# Patient Record
Sex: Female | Born: 1970 | Race: White | Hispanic: No | Marital: Single | Smoking: Current every day smoker
Health system: Southern US, Community
[De-identification: ages and names within clinical notes are randomized; demographics above are authoritative.]

## PROBLEM LIST (undated history)

## (undated) DIAGNOSIS — M1990 Unspecified osteoarthritis, unspecified site: Secondary | ICD-10-CM

## (undated) DIAGNOSIS — Z87442 Personal history of urinary calculi: Secondary | ICD-10-CM

## (undated) DIAGNOSIS — I82409 Acute embolism and thrombosis of unspecified deep veins of unspecified lower extremity: Secondary | ICD-10-CM

---

## 2013-06-29 ENCOUNTER — Ambulatory Visit: Payer: Self-pay | Admitting: Family Medicine

## 2014-09-14 ENCOUNTER — Ambulatory Visit: Payer: Self-pay | Admitting: Podiatry

## 2014-10-18 ENCOUNTER — Ambulatory Visit: Payer: Self-pay | Admitting: Family Medicine

## 2016-02-08 ENCOUNTER — Other Ambulatory Visit: Payer: Self-pay | Admitting: Family Medicine

## 2016-02-08 DIAGNOSIS — R1032 Left lower quadrant pain: Secondary | ICD-10-CM

## 2016-02-09 ENCOUNTER — Other Ambulatory Visit: Payer: Self-pay | Admitting: Family Medicine

## 2016-02-09 DIAGNOSIS — R1032 Left lower quadrant pain: Secondary | ICD-10-CM

## 2016-02-09 DIAGNOSIS — R102 Pelvic and perineal pain: Secondary | ICD-10-CM

## 2016-02-10 ENCOUNTER — Other Ambulatory Visit: Payer: Self-pay | Admitting: Family Medicine

## 2016-02-10 ENCOUNTER — Ambulatory Visit
Admission: RE | Admit: 2016-02-10 | Discharge: 2016-02-10 | Disposition: A | Discharge status: Home / Self Care | Payer: Medicaid Other | Source: Ambulatory Visit | Attending: Family Medicine | Admitting: Family Medicine

## 2016-02-10 ENCOUNTER — Ambulatory Visit: Payer: Medicaid Other

## 2016-02-10 DIAGNOSIS — R109 Unspecified abdominal pain: Secondary | ICD-10-CM

## 2016-02-10 DIAGNOSIS — R1012 Left upper quadrant pain: Secondary | ICD-10-CM | POA: Diagnosis not present

## 2016-02-10 DIAGNOSIS — R102 Pelvic and perineal pain: Secondary | ICD-10-CM

## 2016-02-10 DIAGNOSIS — R1032 Left lower quadrant pain: Secondary | ICD-10-CM

## 2016-03-28 ENCOUNTER — Emergency Department: Payer: Medicaid Other

## 2016-03-28 ENCOUNTER — Encounter: Payer: Self-pay | Admitting: *Deleted

## 2016-03-28 ENCOUNTER — Emergency Department
Admission: EM | Admit: 2016-03-28 | Discharge: 2016-03-28 | Disposition: A | Discharge status: Home / Self Care | Payer: Medicaid Other | Attending: Student | Admitting: Student

## 2016-03-28 DIAGNOSIS — W228XXA Striking against or struck by other objects, initial encounter: Secondary | ICD-10-CM | POA: Diagnosis not present

## 2016-03-28 DIAGNOSIS — Z86718 Personal history of other venous thrombosis and embolism: Secondary | ICD-10-CM | POA: Insufficient documentation

## 2016-03-28 DIAGNOSIS — Y939 Activity, unspecified: Secondary | ICD-10-CM | POA: Insufficient documentation

## 2016-03-28 DIAGNOSIS — Y929 Unspecified place or not applicable: Secondary | ICD-10-CM | POA: Insufficient documentation

## 2016-03-28 DIAGNOSIS — F17200 Nicotine dependence, unspecified, uncomplicated: Secondary | ICD-10-CM | POA: Insufficient documentation

## 2016-03-28 DIAGNOSIS — S60222A Contusion of left hand, initial encounter: Secondary | ICD-10-CM | POA: Diagnosis not present

## 2016-03-28 DIAGNOSIS — S6992XA Unspecified injury of left wrist, hand and finger(s), initial encounter: Secondary | ICD-10-CM | POA: Diagnosis present

## 2016-03-28 DIAGNOSIS — Y999 Unspecified external cause status: Secondary | ICD-10-CM | POA: Insufficient documentation

## 2016-03-28 HISTORY — DX: Acute embolism and thrombosis of unspecified deep veins of unspecified lower extremity: I82.409

## 2016-03-28 MED ORDER — IBUPROFEN 800 MG PO TABS: 800.0000 mg | ORAL_TABLET | Freq: Three times a day (TID) | ORAL | Status: DC | PRN

## 2016-03-28 NOTE — Discharge Instructions (Signed)
Cryotherapy °Cryotherapy means treatment with cold. Ice or gel packs can be used to reduce both pain and swelling. Ice is the most helpful within the first 24 to 48 hours after an injury or flare-up from overusing a muscle or joint. Sprains, strains, spasms, burning pain, shooting pain, and aches can all be eased with ice. Ice can also be used when recovering from surgery. Ice is effective, has very few side effects, and is safe for most people to use. °PRECAUTIONS  °Ice is not a safe treatment option for people with: °· Raynaud phenomenon. This is a condition affecting small blood vessels in the extremities. Exposure to cold may cause your problems to return. °· Cold hypersensitivity. There are many forms of cold hypersensitivity, including: °· Cold urticaria. Red, itchy hives appear on the skin when the tissues begin to warm after being iced. °· Cold erythema. This is a red, itchy rash caused by exposure to cold. °· Cold hemoglobinuria. Red blood cells break down when the tissues begin to warm after being iced. The hemoglobin that carry oxygen are passed into the urine because they cannot combine with blood proteins fast enough. °· Numbness or altered sensitivity in the area being iced. °If you have any of the following conditions, do not use ice until you have discussed cryotherapy with your caregiver: °· Heart conditions, such as arrhythmia, angina, or chronic heart disease. °· High blood pressure. °· Healing wounds or open skin in the area being iced. °· Current infections. °· Rheumatoid arthritis. °· Poor circulation. °· Diabetes. °Ice slows the blood flow in the region it is applied. This is beneficial when trying to stop inflamed tissues from spreading irritating chemicals to surrounding tissues. However, if you expose your skin to cold temperatures for too long or without the proper protection, you can damage your skin or nerves. Watch for signs of skin damage due to cold. °HOME CARE INSTRUCTIONS °Follow  these tips to use ice and cold packs safely. °· Place a dry or damp towel between the ice and skin. A damp towel will cool the skin more quickly, so you may need to shorten the time that the ice is used. °· For a more rapid response, add gentle compression to the ice. °· Ice for no more than 10 to 20 minutes at a time. The bonier the area you are icing, the less time it will take to get the benefits of ice. °· Check your skin after 5 minutes to make sure there are no signs of a poor response to cold or skin damage. °· Rest 20 minutes or more between uses. °· Once your skin is numb, you can end your treatment. You can test numbness by very lightly touching your skin. The touch should be so light that you do not see the skin dimple from the pressure of your fingertip. When using ice, most people will feel these normal sensations in this order: cold, burning, aching, and numbness. °· Do not use ice on someone who cannot communicate their responses to pain, such as small children or people with dementia. °HOW TO MAKE AN ICE PACK °Ice packs are the most common way to use ice therapy. Other methods include ice massage, ice baths, and cryosprays. Muscle creams that cause a cold, tingly feeling do not offer the same benefits that ice offers and should not be used as a substitute unless recommended by your caregiver. °To make an ice pack, do one of the following: °· Place crushed ice or a   bag of frozen vegetables in a sealable plastic bag. Squeeze out the excess air. Place this bag inside another plastic bag. Slide the bag into a pillowcase or place a damp towel between your skin and the bag.  Mix 3 parts water with 1 part rubbing alcohol. Freeze the mixture in a sealable plastic bag. When you remove the mixture from the freezer, it will be slushy. Squeeze out the excess air. Place this bag inside another plastic bag. Slide the bag into a pillowcase or place a damp towel between your skin and the bag. SEEK MEDICAL CARE  IF:  You develop white spots on your skin. This may give the skin a blotchy (mottled) appearance.  Your skin turns blue or pale.  Your skin becomes waxy or hard.  Your swelling gets worse. MAKE SURE YOU:   Understand these instructions.  Will watch your condition.  Will get help right away if you are not doing well or get worse.   This information is not intended to replace advice given to you by your health care provider. Make sure you discuss any questions you have with your health care provider.   Document Released: 05/14/2011 Document Revised: 10/08/2014 Document Reviewed: 05/14/2011 Elsevier Interactive Patient Education 2016 [place] A hand contusion is a deep bruise on your hand area. Contusions are the result of an injury that caused bleeding under the skin. The contusion may turn blue, purple, or yellow. Minor injuries will give you a painless contusion, but more severe contusions may stay painful and swollen for a few weeks. CAUSES  A contusion is usually caused by a blow, trauma, or direct force to an area of the body. SYMPTOMS   Swelling and redness of the injured area.  Discoloration of the injured area.  Tenderness and soreness of the injured area.  Pain. DIAGNOSIS  The diagnosis can be made by taking a history and performing a physical exam. An X-ray, CT scan, or MRI may be needed to determine if there were any associated injuries, such as broken bones (fractures). TREATMENT  Often, the best treatment for a hand contusion is resting, elevating, icing, and applying cold compresses to the injured area. Over-the-counter medicines may also be recommended for pain control. HOME CARE INSTRUCTIONS   Put ice on the injured area.  Put ice in a plastic bag.  Place a towel between your skin and the bag.  Leave the ice on for 15-20 minutes, 03-04 times a day.  Only take over-the-counter or prescription medicines as directed by your caregiver.  Your caregiver may recommend avoiding anti-inflammatory medicines (aspirin, ibuprofen, and naproxen) for 48 hours because these medicines may increase bruising.  If told, use an elastic wrap as directed. This can help reduce swelling. You may remove the wrap for sleeping, showering, and bathing. If your fingers become numb, cold, or blue, take the wrap off and reapply it more loosely.  Elevate your hand with pillows to reduce swelling.  Avoid overusing your hand if it is painful. SEEK IMMEDIATE MEDICAL CARE IF:   You have increased redness, swelling, or pain in your hand.  Your swelling or pain is not relieved with medicines.  You have loss of feeling in your hand or are unable to move your fingers.  Your hand turns cold or blue.  You have pain when you move your fingers.  Your hand becomes warm to the touch.  Your contusion does not improve in 2 days. MAKE SURE YOU:   Understand  these instructions.  Will watch your condition.  Will get help right away if you are not doing well or get worse.   This information is not intended to replace advice given to you by your health care provider. Make sure you discuss any questions you have with your health care provider.   Document Released: 03/09/2002 Document Revised: 06/11/2012 Document Reviewed: 03/10/2012 Elsevier Interactive Patient Education [employer].

## 2016-03-28 NOTE — ED Provider Notes (Signed)
[facility] Emergency Department Provider Note  ____________________________________________  Time seen: Approximately 2:08 PM  I have reviewed the triage vital signs and the nursing notes.   HISTORY  Chief Complaint Hand Pain    HPI Victoria Kane is a 45 y.o. female presents for evaluation of left hand pain. Patient states that she was moving furniture yesterday when her watch hit her hand. Complains of continued pain and swelling. Denies any numbness or tingling. His pain is 4/10 at this time.   Past Medical History  Diagnosis Date  . DVT (deep venous thrombosis) ([place])     There are no active problems to display for this patient.   History reviewed. No pertinent past surgical history.  Current Outpatient Rx  Name  Route  Sig  Dispense  Refill  . ibuprofen (ADVIL,MOTRIN) 800 MG tablet   Oral   Take 1 tablet (800 mg total) by mouth every 8 (eight) hours as needed.   30 tablet   0     Allergies Review of patient's allergies indicates no known allergies.  History reviewed. No pertinent family history.  Social History Social History  Substance Use Topics  . Smoking status: Current Every Day Smoker  . Smokeless tobacco: None  . Alcohol Use: None    Review of Systems Constitutional: No fever/chills Musculoskeletal: Positive for left hand pain. Skin: Negative for rash. Neurological: Negative for headaches, focal weakness or numbness.  10-point ROS otherwise negative.  ____________________________________________   PHYSICAL EXAM:  VITAL SIGNS: ED Triage Vitals  Enc Vitals Group     BP 03/28/16 1322 125/70 mmHg     Pulse Rate 03/28/16 1322 72     Resp 03/28/16 1322 18     Temp 03/28/16 1322 98.1 F (36.7 C)     Temp Source 03/28/16 1322 Oral     SpO2 03/28/16 1322 100 %     Weight 03/28/16 1322 240 lb (108.863 kg)     Height 03/28/16 1322 5\' 4"  (1.626 m)     Head Cir --      Peak Flow --      Pain Score --      Pain Loc  --      Pain Edu? --      Excl. in [place]? --     Constitutional: Alert and oriented. Well appearing and in no acute distress. Musculoskeletal: No lower extremity tenderness nor edema.  No joint effusions.Left hand full range of motion with point tenderness noticed the dorsal aspect of the carpal region. Good capillary refill. Distally neurovascularly intact. Neurologic:  Normal speech and language. No gross focal neurologic deficits are appreciated. No gait instability. Skin:  Skin is warm, dry and intact. No rash noted. Psychiatric: Mood and affect are normal. Speech and behavior are normal.  ____________________________________________   LABS (all labs ordered are listed, but only abnormal results are displayed)  Labs Reviewed - No data to display ____________________________________________  EKG   ____________________________________________  RADIOLOGY  No acute osseous findings. ____________________________________________   PROCEDURES  Procedure(s) performed: None  Critical Care performed: No  ____________________________________________   INITIAL IMPRESSION / ASSESSMENT AND PLAN / ED COURSE  Pertinent labs & imaging results that were available during my care of the patient were reviewed by me and considered in my medical decision making (see chart for details).  Left hand contusion. Reassurance right to the patient Rx given for Motrin 800 mg 3 times a day. Patient follow-up with PCP or return to the ER worsening  symptomology. ____________________________________________   FINAL CLINICAL IMPRESSION(S) / ED DIAGNOSES  Final diagnoses:  Hand contusion, left, initial encounter     This chart was dictated using voice recognition software/Dragon. Despite best efforts to proofread, errors can occur which can change the meaning. Any change was purely unintentional.   Arlyss Repress, PA-C 03/28/16 1511  Joanne Gavel, MD 03/28/16 [phone]

## 2016-03-28 NOTE — ED Notes (Signed)
Pt states her watch hit her left hand and she states hand swelling

## 2016-08-25 ENCOUNTER — Emergency Department
Admission: EM | Admit: 2016-08-25 | Discharge: 2016-08-25 | Disposition: A | Discharge status: Home / Self Care | Payer: Medicaid Other | Attending: Student in an Organized Health Care Education/Training Program | Admitting: Student in an Organized Health Care Education/Training Program

## 2016-08-25 ENCOUNTER — Encounter: Payer: Self-pay | Admitting: *Deleted

## 2016-08-25 DIAGNOSIS — K0889 Other specified disorders of teeth and supporting structures: Secondary | ICD-10-CM | POA: Diagnosis present

## 2016-08-25 DIAGNOSIS — F17200 Nicotine dependence, unspecified, uncomplicated: Secondary | ICD-10-CM | POA: Insufficient documentation

## 2016-08-25 DIAGNOSIS — Z791 Long term (current) use of non-steroidal anti-inflammatories (NSAID): Secondary | ICD-10-CM | POA: Diagnosis not present

## 2016-08-25 DIAGNOSIS — K0500 Acute gingivitis, plaque induced: Secondary | ICD-10-CM

## 2016-08-25 MED ORDER — LIDOCAINE VISCOUS 2 % MT SOLN
15.0000 mL | Freq: Once | OROMUCOSAL | Status: AC
  Administered 2016-08-25: 15 mL via OROMUCOSAL
  Filled 2016-08-25: qty 15

## 2016-08-25 MED ORDER — IBUPROFEN 600 MG PO TABS
600.0000 mg | ORAL_TABLET | Freq: Once | ORAL | Status: AC
Start: 2016-08-25 — End: 2016-08-25
  Administered 2016-08-25: 600 mg via ORAL
  Filled 2016-08-25: qty 1

## 2016-08-25 MED ORDER — TRAMADOL HCL 50 MG PO TABS: 50.0000 mg | ORAL_TABLET | Freq: Four times a day (QID) | ORAL | 0 refills | Status: DC | PRN

## 2016-08-25 MED ORDER — LIDOCAINE VISCOUS 2 % MT SOLN: 5.0000 mL | Freq: Four times a day (QID) | OROMUCOSAL | 0 refills | Status: DC | PRN

## 2016-08-25 MED ORDER — AMOXICILLIN 500 MG PO CAPS: 500.0000 mg | ORAL_CAPSULE | Freq: Three times a day (TID) | ORAL | 0 refills | Status: DC

## 2016-08-25 MED ORDER — IBUPROFEN 600 MG PO TABS: 600.0000 mg | ORAL_TABLET | Freq: Three times a day (TID) | ORAL | 0 refills | Status: DC | PRN

## 2016-08-25 MED ORDER — TRAMADOL HCL 50 MG PO TABS
50.0000 mg | ORAL_TABLET | Freq: Once | ORAL | Status: AC
  Administered 2016-08-25: 50 mg via ORAL
  Filled 2016-08-25: qty 1

## 2016-08-25 NOTE — ED Triage Notes (Signed)
Patient c/o gum abscess that began last night

## 2016-08-25 NOTE — ED Provider Notes (Signed)
[facility] Emergency Department Provider Note   ____________________________________________   First MD Initiated Contact with Patient 08/25/16 1201     (approximate)  I have reviewed the triage vital signs and the nursing notes.   HISTORY  Chief Complaint Dental Pain    HPI Victoria Kane is a 45 y.o. female patient complain of swelling along the upper gum which began last night.Patient get awakened this morning with increased edema and pain. Patient rates the pain as a 7/10. Patient described a pain as "achy". Palliative measures taken for this complaint.   Past Medical History:  Diagnosis Date  . DVT (deep venous thrombosis) ([place])     There are no active problems to display for this patient.   History reviewed. No pertinent surgical history.  Prior to Admission medications   Medication Sig Start Date End Date Taking? Authorizing Provider  amoxicillin (AMOXIL) 500 MG capsule Take 1 capsule (500 mg total) by mouth 3 (three) times daily. 08/25/16   Sable Feil, PA-C  ibuprofen (ADVIL,MOTRIN) 600 MG tablet Take 1 tablet (600 mg total) by mouth every 8 (eight) hours as needed. 08/25/16   Sable Feil, PA-C  ibuprofen (ADVIL,MOTRIN) 800 MG tablet Take 1 tablet (800 mg total) by mouth every 8 (eight) hours as needed. 03/28/16   Pierce Crane Beers, PA-C  lidocaine (XYLOCAINE) 2 % solution Use as directed 5 mLs in the mouth or throat every 6 (six) hours as needed for mouth pain. 08/25/16   Sable Feil, PA-C  traMADol (ULTRAM) 50 MG tablet Take 1 tablet (50 mg total) by mouth every 6 (six) hours as needed for moderate pain. 08/25/16   Sable Feil, PA-C    Allergies Tylenol [acetaminophen]  No family history on file.  Social History Social History  Substance Use Topics  . Smoking status: Current Every Day Smoker  . Smokeless tobacco: Never Used  . Alcohol use No    Review of Systems Constitutional: No fever/chills Eyes: No visual  changes. ENT: No sore throat.Upper gum pain  Cardiovascular: Denies chest pain. Respiratory: Denies shortness of breath. Gastrointestinal: No abdominal pain.  No nausea, no vomiting.  No diarrhea.  No constipation. Genitourinary: Negative for dysuria. Musculoskeletal: Negative for back pain. Skin: Negative for rash. Neurological: Negative for headaches, focal weakness or numbness.    ____________________________________________   PHYSICAL EXAM:  VITAL SIGNS: ED Triage Vitals  Enc Vitals Group     BP 08/25/16 1118 133/66     Pulse Rate 08/25/16 1118 70     Resp 08/25/16 1118 18     Temp 08/25/16 1118 98.3 F (36.8 C)     Temp Source 08/25/16 1118 Oral     SpO2 08/25/16 1118 100 %     Weight 08/25/16 1120 234 lb (106.1 kg)     Height 08/25/16 1120 5\' 4"  (1.626 m)     Head Circumference --      Peak Flow --      Pain Score --      Pain Loc --      Pain Edu? --      Excl. in [place]? --     Constitutional: Alert and oriented. Well appearing and in no acute distress. Eyes: Conjunctivae are normal. PERRL. EOMI. Head: Atraumatic. Nose: No congestion/rhinnorhea. Mouth/Throat: Mucous membranes are moist.  Oropharynx non-erythematous. Edematous and erythematous upper gingiva. Neck: No stridor.  No cervical spine tenderness to palpation. Hematological/Lymphatic/Immunilogical: No cervical lymphadenopathy. Cardiovascular: Normal rate, regular rhythm. Grossly normal  heart sounds.  Good peripheral circulation. Respiratory: Normal respiratory effort.  No retractions. Lungs CTAB. Gastrointestinal: Soft and nontender. No distention. No abdominal bruits. No CVA tenderness. Musculoskeletal: No lower extremity tenderness nor edema.  No joint effusions. Neurologic:  Normal speech and language. No gross focal neurologic deficits are appreciated. No gait instability. Skin:  Skin is warm, dry and intact. No rash noted. Psychiatric: Mood and affect are normal. Speech and behavior are  normal.  ____________________________________________   LABS (all labs ordered are listed, but only abnormal results are displayed)  Labs Reviewed - No data to display ____________________________________________  EKG   ____________________________________________  RADIOLOGY   ____________________________________________   PROCEDURES  Procedure(s) performed: None  Procedures  Critical Care performed: No  ____________________________________________   INITIAL IMPRESSION / ASSESSMENT AND PLAN / ED COURSE  Pertinent labs & imaging results that were available during my care of the patient were reviewed by me and considered in my medical decision making (see chart for details).  Gingivitis. Patient given discharge care instructions. Patient given prescription for viscous lidocaine, amoxicillin, tramadol and ibuprofen. Patient advised follow-up with dentist of choice next week.  Clinical Course      ____________________________________________   FINAL CLINICAL IMPRESSION(S) / ED DIAGNOSES  Final diagnoses:  Acute gingivitis      NEW MEDICATIONS STARTED DURING THIS VISIT:  New Prescriptions   AMOXICILLIN (AMOXIL) 500 MG CAPSULE    Take 1 capsule (500 mg total) by mouth 3 (three) times daily.   IBUPROFEN (ADVIL,MOTRIN) 600 MG TABLET    Take 1 tablet (600 mg total) by mouth every 8 (eight) hours as needed.   LIDOCAINE (XYLOCAINE) 2 % SOLUTION    Use as directed 5 mLs in the mouth or throat every 6 (six) hours as needed for mouth pain.   TRAMADOL (ULTRAM) 50 MG TABLET    Take 1 tablet (50 mg total) by mouth every 6 (six) hours as needed for moderate pain.     Note:  This document was prepared using Dragon voice recognition software and may include unintentional dictation errors.    Sable Feil, PA-C 08/25/16 1211    Merlyn Lot, MD 08/25/16 [phone]

## 2016-11-14 ENCOUNTER — Emergency Department
Admission: EM | Admit: 2016-11-14 | Discharge: 2016-11-14 | Disposition: A | Discharge status: Home / Self Care | Payer: Medicaid Other | Attending: Emergency Medicine | Admitting: Emergency Medicine

## 2016-11-14 DIAGNOSIS — B719 Cestode infection, unspecified: Secondary | ICD-10-CM | POA: Diagnosis present

## 2016-11-14 DIAGNOSIS — F17200 Nicotine dependence, unspecified, uncomplicated: Secondary | ICD-10-CM | POA: Diagnosis not present

## 2016-11-14 MED ORDER — PRAZIQUANTEL 600 MG PO TABS: 2400.0000 mg | ORAL_TABLET | Freq: Once | ORAL | 0 refills | Status: AC

## 2016-11-14 NOTE — ED Notes (Signed)
Pt reports that she has new puppies and she was concerned that she needed to look for parasites after seeing worms in their stools. She went to her MD, who refused to test her for it. Pt then went to her veterinarian with a stool sample, and was shown under the microscope that she had tapeworms. He told her that she needed to have antibiotics to kill worms so she won't get cancer or have mental problems from untreated tapeworms. Pt is hostile and states that no one believes her.

## 2016-11-14 NOTE — ED Provider Notes (Signed)
[facility] Emergency Department Provider Note  ____________________________________________  Time seen: Approximately 2:36 PM  I have reviewed the triage vital signs and the nursing notes.   HISTORY  Chief Complaint Weight Loss and worms   HPI Victoria Kane is a 46 y.o. female who presents concerned that she has tapeworms. Patient is here with her daughter and both are being evaluated for the same complaint. According to them they have multiple dogs in the house that have been diagnosed with tapeworms. They went to their primary care doctor who according to them refused to check them for tapeworms. Today they brought a stool sample to their dog's veterinarianand they both have a letter showing that the stool was positive for tapeworm that it is signed by the veterinarian. They report weight loss over the course of the last 3 months with no changes in appetite and normal by mouth intake. They have no other medical complaints at this time such as flulike symptoms, abdominal pain, chest pain, shortness of breath, nausea, vomiting or diarrhea. They're here requesting medication for tapeworms.   Past Medical History:  Diagnosis Date  . DVT (deep venous thrombosis) ([place])     There are no active problems to display for this patient.   Past Surgical History:  Procedure Laterality Date  . CESAREAN SECTION      Prior to Admission medications   Medication Sig Start Date End Date Taking? Authorizing Provider  amoxicillin (AMOXIL) 500 MG capsule Take 1 capsule (500 mg total) by mouth 3 (three) times daily. 08/25/16   Sable Feil, PA-C  ibuprofen (ADVIL,MOTRIN) 600 MG tablet Take 1 tablet (600 mg total) by mouth every 8 (eight) hours as needed. 08/25/16   Sable Feil, PA-C  ibuprofen (ADVIL,MOTRIN) 800 MG tablet Take 1 tablet (800 mg total) by mouth every 8 (eight) hours as needed. 03/28/16   Pierce Crane Beers, PA-C  lidocaine (XYLOCAINE) 2 % solution Use as  directed 5 mLs in the mouth or throat every 6 (six) hours as needed for mouth pain. 08/25/16   Sable Feil, PA-C  praziquantel (BILTRICIDE) 600 MG tablet Take 4 tablets (2,400 mg total) by mouth once. 11/14/16 11/14/16  Rudene Re, MD  traMADol (ULTRAM) 50 MG tablet Take 1 tablet (50 mg total) by mouth every 6 (six) hours as needed for moderate pain. 08/25/16   Sable Feil, PA-C    Allergies Tylenol [acetaminophen]  No family history on file.  Social History Social History  Substance Use Topics  . Smoking status: Current Every Day Smoker  . Smokeless tobacco: Never Used  . Alcohol use No    Review of Systems  Constitutional: Negative for fever. Eyes: Negative for visual changes. ENT: Negative for sore throat. Neck: No neck pain  Cardiovascular: Negative for chest pain. Respiratory: Negative for shortness of breath. Gastrointestinal: Negative for abdominal pain, vomiting or diarrhea. Genitourinary: Negative for dysuria. Musculoskeletal: Negative for back pain. Skin: Negative for rash. Neurological: Negative for headaches, weakness or numbness. Psych: No SI or HI  ____________________________________________   PHYSICAL EXAM:  VITAL SIGNS: ED Triage Vitals [11/14/16 1123]  Enc Vitals Group     BP 117/60     Pulse Rate 74     Resp 18     Temp 98.2 F (36.8 C)     Temp Source Oral     SpO2 100 %     Weight 226 lb (102.5 kg)     Height 5\' 4"  (1.626 m)  Head Circumference      Peak Flow      Pain Score      Pain Loc      Pain Edu?      Excl. in [place]?     Constitutional: Alert and oriented. Well appearing and in no apparent distress. HEENT:      Head: Normocephalic and atraumatic.         Eyes: Conjunctivae are normal. Sclera is non-icteric. EOMI. PERRL      Mouth/Throat: Mucous membranes are moist.       Neck: Supple with no signs of meningismus. Cardiovascular: Regular rate and rhythm. No murmurs, gallops, or rubs. 2+ symmetrical distal pulses are  present in all extremities. No JVD. Respiratory: Normal respiratory effort. Lungs are clear to auscultation bilaterally. No wheezes, crackles, or rhonchi.  Gastrointestinal: Soft, non tender, and non distended with positive bowel sounds. No rebound or guarding. Genitourinary: No CVA tenderness. Musculoskeletal: Nontender with normal range of motion in all extremities. No edema, cyanosis, or erythema of extremities. Neurologic: Normal speech and language. Face is symmetric. Moving all extremities. No gross focal neurologic deficits are appreciated. Skin: Skin is warm, dry and intact. No rash noted. Psychiatric: Mood and affect are normal. Speech and behavior are normal.  ____________________________________________   LABS (all labs ordered are listed, but only abnormal results are displayed)  Labs Reviewed - No data to display ____________________________________________  EKG  none  ____________________________________________  RADIOLOGY  none  ____________________________________________   PROCEDURES  Procedure(s) performed: None Procedures Critical Care performed:  None ____________________________________________   INITIAL IMPRESSION / ASSESSMENT AND PLAN / ED COURSE  78F female who presents concerned that she has tapeworms. He has a note with her from her Landscape architect where patient's stool was tested positive for tapeworms. She has no medical complaints and normal physical exam. We'll provide with a prescription for praziquantel and recommended close follow-up with primary care doctor.     Pertinent labs & imaging results that were available during my care of the patient were reviewed by me and considered in my medical decision making (see chart for details).    ____________________________________________   FINAL CLINICAL IMPRESSION(S) / ED DIAGNOSES  Final diagnoses:  Tapeworm infection      NEW MEDICATIONS STARTED DURING THIS VISIT:  New  Prescriptions   PRAZIQUANTEL (BILTRICIDE) 600 MG TABLET    Take 4 tablets (2,400 mg total) by mouth once.     Note:  This document was prepared using Dragon voice recognition software and may include unintentional dictation errors.    Rudene Re, MD 11/14/16 1438

## 2016-11-14 NOTE — ED Triage Notes (Signed)
Pt c/o loosing 50lb in the past 2-3 months and had her dogs vet check her stools and she was positive for tapeworm and another parasite.denies any pain.

## 2016-11-14 NOTE — ED Notes (Signed)
Pt discharged home after verbalizing understanding of discharge instructions; nad noted. 

## 2017-08-13 ENCOUNTER — Other Ambulatory Visit: Payer: Self-pay | Admitting: Family Medicine

## 2017-08-13 DIAGNOSIS — R102 Pelvic and perineal pain: Secondary | ICD-10-CM

## 2017-08-16 ENCOUNTER — Ambulatory Visit: Payer: Self-pay

## 2017-12-25 ENCOUNTER — Emergency Department: Payer: Self-pay

## 2017-12-25 ENCOUNTER — Emergency Department
Admission: EM | Admit: 2017-12-25 | Discharge: 2017-12-25 | Disposition: A | Discharge status: Home / Self Care | Payer: Self-pay | Attending: Emergency Medicine | Admitting: Emergency Medicine

## 2017-12-25 ENCOUNTER — Other Ambulatory Visit: Payer: Self-pay

## 2017-12-25 DIAGNOSIS — X500XXA Overexertion from strenuous movement or load, initial encounter: Secondary | ICD-10-CM | POA: Insufficient documentation

## 2017-12-25 DIAGNOSIS — Y929 Unspecified place or not applicable: Secondary | ICD-10-CM | POA: Insufficient documentation

## 2017-12-25 DIAGNOSIS — F17200 Nicotine dependence, unspecified, uncomplicated: Secondary | ICD-10-CM | POA: Insufficient documentation

## 2017-12-25 DIAGNOSIS — Y999 Unspecified external cause status: Secondary | ICD-10-CM | POA: Insufficient documentation

## 2017-12-25 DIAGNOSIS — Y9389 Activity, other specified: Secondary | ICD-10-CM | POA: Insufficient documentation

## 2017-12-25 DIAGNOSIS — S8391XA Sprain of unspecified site of right knee, initial encounter: Secondary | ICD-10-CM | POA: Insufficient documentation

## 2017-12-25 DIAGNOSIS — S8001XA Contusion of right knee, initial encounter: Secondary | ICD-10-CM | POA: Insufficient documentation

## 2017-12-25 MED ORDER — MELOXICAM 15 MG PO TABS: 15.0000 mg | ORAL_TABLET | Freq: Every day | ORAL | 2 refills | Status: AC

## 2017-12-25 MED ORDER — TRAMADOL HCL 50 MG PO TABS: 50.0000 mg | ORAL_TABLET | Freq: Four times a day (QID) | ORAL | 0 refills | Status: DC | PRN

## 2017-12-25 NOTE — ED Triage Notes (Signed)
Pt reports that she twisted her right knee the day before yesterday, she states that she fell on the same knee yesterday. Pt is able to ambulate and bear weight on right knee.

## 2017-12-25 NOTE — ED Provider Notes (Signed)
[facility] Emergency Department Provider Note  ____________________________________________   First MD Initiated Contact with Patient 12/25/17 1028     (approximate)  I have reviewed the triage vital signs and the nursing notes.   HISTORY  Chief Complaint Knee Pain   HPI Victoria Kane is a 47 y.o. female is here complaint of right knee pain.  Patient states that she had a twisting injury while playing ball with her son.  She states that later she actually fell onto her knee.  Patient has continued to have pain and has taken some over-the-counter medication.  She states pain is increased with weightbearing and rotating her knee.  She denies any head injury or loss of consciousness with her fall.  She rates her pain as an 8 out of 10.  Past Medical History:  Diagnosis Date  . DVT (deep venous thrombosis) ([place])     There are no active problems to display for this patient.   Past Surgical History:  Procedure Laterality Date  . CESAREAN SECTION      Prior to Admission medications   Medication Sig Start Date End Date Taking? Authorizing Provider  meloxicam (MOBIC) 15 MG tablet Take 1 tablet (15 mg total) by mouth daily. 12/25/17 12/25/18  Johnn Hai, PA-C  traMADol (ULTRAM) 50 MG tablet Take 1 tablet (50 mg total) by mouth every 6 (six) hours as needed. 12/25/17   Johnn Hai, PA-C    Allergies Tylenol [acetaminophen]  No family history on file.  Social History Social History   Tobacco Use  . Smoking status: Current Every Day Smoker  . Smokeless tobacco: Never Used  Substance Use Topics  . Alcohol use: No  . Drug use: No    Review of Systems Constitutional: No fever/chills Cardiovascular: Denies chest pain. Respiratory: Denies shortness of breath. Gastrointestinal: No abdominal pain.  No nausea, no vomiting.  Genitourinary: Negative for dysuria. Musculoskeletal: Positive for right knee pain. Skin: Negative for  rash. Neurological: Negative for headaches, focal weakness or numbness. ____________________________________________   PHYSICAL EXAM:  VITAL SIGNS: ED Triage Vitals  Enc Vitals Group     BP 12/25/17 1024 (!) 124/56     Pulse Rate 12/25/17 1024 71     Resp 12/25/17 1024 18     Temp 12/25/17 1024 97.8 F (36.6 C)     Temp Source 12/25/17 1024 Oral     SpO2 12/25/17 1024 97 %     Weight 12/25/17 1025 260 lb (117.9 kg)     Height 12/25/17 1025 5\' 4"  (1.626 m)     Head Circumference --      Peak Flow --      Pain Score 12/25/17 1025 8     Pain Loc --      Pain Edu? --      Excl. in [place]? --    Constitutional: Alert and oriented. Well appearing and in no acute distress. Eyes: Conjunctivae are normal.  Head: Atraumatic. Neck: No stridor.   Cardiovascular: Normal rate, regular rhythm. Grossly normal heart sounds.  Good peripheral circulation. Respiratory: Normal respiratory effort.  No retractions. Lungs CTAB. Musculoskeletal: On examination of the right knee there is no gross deformity and no obvious effusion is present.  There is no abrasion or ecchymosis present.  There is diffuse tenderness on palpation of the anterior knee.  Range of motion is with crepitus.  Motor sensory function intact distal to the injury.  Ligaments were difficult to assess secondary to patient's pain.  Neurologic:  Normal speech and language. No gross focal neurologic deficits are appreciated.  Skin:  Skin is warm, dry and intact.  Psychiatric: Mood and affect are normal. Speech and behavior are normal.  ____________________________________________   LABS (all labs ordered are listed, but only abnormal results are displayed)  Labs Reviewed - No data to display  RADIOLOGY  ED MD interpretation: Right knee x-ray is negative for acute fracture.  Official radiology report(s): Dg Knee Complete 4 Views Right  Result Date: 12/25/2017 CLINICAL DATA:  Twisting injury of the knee when playing ball with her  son. Subsequent to that injury she fell on the knee. The patient has persistent pain with weight-bearing and rotating the knee. EXAM: RIGHT KNEE - COMPLETE 4+ VIEW COMPARISON:  None in PACs FINDINGS: The bones are subjectively adequately mineralized. The joint spaces are well maintained. There is no acute fracture or dislocation. There is mild beaking of the tibial spines and superior and inferior articular margins of the patella. There is no joint effusion. IMPRESSION: There is no acute bony abnormality of the right knee. Mild osteophyte formation is consistent with early osteoarthritic changes. Electronically Signed   By: David  Martinique M.D.   On: 12/25/2017 10:53  ____________________________________________   PROCEDURES  Procedure(s) performed: None  Procedures  Critical Care performed: No  ____________________________________________   INITIAL IMPRESSION / ASSESSMENT AND PLAN / ED COURSE  As part of my medical decision making, I reviewed the following data within the electronic MEDICAL RECORD NUMBER Notes from prior ED visits and [place] Controlled Substance Database  Patient is here with complaint of right knee pain after a twisting injury to her right knee and fall.  X-rays were negative for fracture.  Patient was reassured.  Patient was placed in a knee immobilizer and given a prescription for meloxicam 15 mg 1 daily for arthritic changes.  She was also given a prescription for tramadol 50 mg 1 every 6 hours as needed for moderate pain.  #10 no refill.  Patient is to ice and elevate as needed for discomfort.  She is to follow-up with her PCP if any continued problems.  ____________________________________________   FINAL CLINICAL IMPRESSION(S) / ED DIAGNOSES  Final diagnoses:  Sprain of right knee, unspecified ligament, initial encounter  Contusion of right knee, initial encounter     ED Discharge Orders        Ordered    meloxicam (MOBIC) 15 MG tablet  Daily     12/25/17 1132     traMADol (ULTRAM) 50 MG tablet  Every 6 hours PRN     12/25/17 1132       Note:  This document was prepared using Dragon voice recognition software and may include unintentional dictation errors.    Johnn Hai, PA-C 12/25/17 1257    Lisa Roca, MD 12/25/17 [phone]

## 2017-12-25 NOTE — Discharge Instructions (Addendum)
Ice and elevate as needed for swelling.  Wear knee immobilizer for support.  Follow-up with your primary care provider if any continued problems.  Take meloxicam 15 mg 1 daily with food for inflammation.  You may also take tramadol 50 mg 1 every 6 hours as needed for moderate to severe pain.

## 2019-03-17 ENCOUNTER — Other Ambulatory Visit: Payer: Self-pay | Admitting: Family Medicine

## 2019-03-17 DIAGNOSIS — Z1231 Encounter for screening mammogram for malignant neoplasm of breast: Secondary | ICD-10-CM

## 2019-04-20 ENCOUNTER — Other Ambulatory Visit: Payer: Self-pay | Admitting: Physician Assistant

## 2019-04-20 DIAGNOSIS — K118 Other diseases of salivary glands: Secondary | ICD-10-CM

## 2019-04-24 ENCOUNTER — Ambulatory Visit
Admission: RE | Admit: 2019-04-24 | Discharge: 2019-04-24 | Disposition: A | Discharge status: Home / Self Care | Payer: BLUE CROSS/BLUE SHIELD | Source: Ambulatory Visit | Attending: Family Medicine | Admitting: Family Medicine

## 2019-04-24 ENCOUNTER — Other Ambulatory Visit: Payer: Self-pay

## 2019-04-24 DIAGNOSIS — Z1231 Encounter for screening mammogram for malignant neoplasm of breast: Secondary | ICD-10-CM | POA: Diagnosis not present

## 2019-04-28 ENCOUNTER — Ambulatory Visit: Payer: BLUE CROSS/BLUE SHIELD | Attending: Physician Assistant

## 2019-06-17 ENCOUNTER — Emergency Department: Payer: BLUE CROSS/BLUE SHIELD

## 2019-06-17 ENCOUNTER — Other Ambulatory Visit: Payer: Self-pay

## 2019-06-17 ENCOUNTER — Emergency Department
Admission: EM | Admit: 2019-06-17 | Discharge: 2019-06-17 | Disposition: A | Discharge status: Home / Self Care | Payer: BLUE CROSS/BLUE SHIELD | Attending: Emergency Medicine | Admitting: Emergency Medicine

## 2019-06-17 ENCOUNTER — Encounter: Payer: Self-pay | Admitting: Emergency Medicine

## 2019-06-17 DIAGNOSIS — F17210 Nicotine dependence, cigarettes, uncomplicated: Secondary | ICD-10-CM | POA: Insufficient documentation

## 2019-06-17 DIAGNOSIS — R1030 Lower abdominal pain, unspecified: Secondary | ICD-10-CM | POA: Diagnosis present

## 2019-06-17 DIAGNOSIS — N23 Unspecified renal colic: Secondary | ICD-10-CM

## 2019-06-17 LAB — CBC
HCT: 46.3 % — ABNORMAL HIGH (ref 36.0–46.0)
Hemoglobin: 15.2 g/dL — ABNORMAL HIGH (ref 12.0–15.0)
MCH: 30.9 pg (ref 26.0–34.0)
MCHC: 32.8 g/dL (ref 30.0–36.0)
MCV: 94.1 fL (ref 80.0–100.0)
Platelets: 237 10*3/uL (ref 150–400)
RBC: 4.92 MIL/uL (ref 3.87–5.11)
RDW: 13.2 % (ref 11.5–15.5)
WBC: 7.6 10*3/uL (ref 4.0–10.5)
nRBC: 0 % (ref 0.0–0.2)

## 2019-06-17 LAB — COMPREHENSIVE METABOLIC PANEL
ALT: 17 U/L (ref 0–44)
AST: 18 U/L (ref 15–41)
Albumin: 4.4 g/dL (ref 3.5–5.0)
Alkaline Phosphatase: 86 U/L (ref 38–126)
Anion gap: 9 (ref 5–15)
BUN: 15 mg/dL (ref 6–20)
CO2: 24 mmol/L (ref 22–32)
Calcium: 9 mg/dL (ref 8.9–10.3)
Chloride: 106 mmol/L (ref 98–111)
Creatinine, Ser: 0.74 mg/dL (ref 0.44–1.00)
GFR calc Af Amer: >60 mL/min (ref >60)
GFR calc non Af Amer: >60 mL/min (ref >60)
Glucose, Bld: 122 mg/dL — ABNORMAL HIGH (ref 70–99)
Potassium: 4.2 mmol/L (ref 3.5–5.1)
Sodium: 139 mmol/L (ref 135–145)
Total Bilirubin: 0.6 mg/dL (ref 0.3–1.2)
Total Protein: 7.7 g/dL (ref 6.5–8.1)

## 2019-06-17 LAB — URINALYSIS, COMPLETE (UACMP) WITH MICROSCOPIC
Bacteria, UA: NONE SEEN
Bilirubin Urine: NEGATIVE
Glucose, UA: NEGATIVE mg/dL
Ketones, ur: NEGATIVE mg/dL
Leukocytes,Ua: NEGATIVE
Nitrite: NEGATIVE
Protein, ur: NEGATIVE mg/dL
Specific Gravity, Urine: 1.017 (ref 1.005–1.030)
pH: 5 (ref 5.0–8.0)

## 2019-06-17 LAB — POCT PREGNANCY, URINE: Preg Test, Ur: NEGATIVE

## 2019-06-17 LAB — LIPASE, BLOOD: Lipase: 24 U/L (ref 11–51)

## 2019-06-17 MED ORDER — TAMSULOSIN HCL 0.4 MG PO CAPS: 0.4000 mg | ORAL_CAPSULE | Freq: Every day | ORAL | 0 refills | Status: DC

## 2019-06-17 MED ORDER — TAMSULOSIN HCL 0.4 MG PO CAPS
0.4000 mg | ORAL_CAPSULE | Freq: Once | ORAL | Status: AC
  Administered 2019-06-17: 0.4 mg via ORAL
  Filled 2019-06-17: qty 1

## 2019-06-17 MED ORDER — ONDANSETRON 4 MG PO TBDP: 4.0000 mg | ORAL_TABLET | Freq: Three times a day (TID) | ORAL | 0 refills | Status: DC | PRN

## 2019-06-17 MED ORDER — KETOROLAC TROMETHAMINE 60 MG/2ML IM SOLN
60.0000 mg | Freq: Once | INTRAMUSCULAR | Status: AC
  Administered 2019-06-17: 60 mg via INTRAMUSCULAR
  Filled 2019-06-17: qty 2

## 2019-06-17 MED ORDER — KETOROLAC TROMETHAMINE 10 MG PO TABS: 10.0000 mg | ORAL_TABLET | Freq: Four times a day (QID) | ORAL | 0 refills | Status: DC | PRN

## 2019-06-17 MED ORDER — ONDANSETRON 4 MG PO TBDP
4.0000 mg | ORAL_TABLET | Freq: Once | ORAL | Status: AC
  Administered 2019-06-17: 4 mg via ORAL
  Filled 2019-06-17: qty 1

## 2019-06-17 NOTE — ED Triage Notes (Signed)
Pt in via POV, reports LLQ abdominal pain x one day, denies N/V/D.  Appears uncomfortable, vitals WDL.

## 2019-06-17 NOTE — ED Provider Notes (Signed)
[facility] Emergency Department Provider Note       Time seen: ----------------------------------------- 1:30 PM on 06/17/2019 -----------------------------------------   I have reviewed the triage vital signs and the nursing notes.  HISTORY   Chief Complaint Abdominal Pain    HPI Victoria Kane is a 48 y.o. female with a history of DVT who presents to the ED for lower quadrant abdominal pain for 1 day.  She denies nausea, vomiting or diarrhea.  Pain is 10 out of 10 in the lower abdomen.  She has never had a kidney stone before, nothing makes the pain better or worse.  Past Medical History:  Diagnosis Date  . DVT (deep venous thrombosis) ([place])     There are no active problems to display for this patient.   Past Surgical History:  Procedure Laterality Date  . CESAREAN SECTION      Allergies Tylenol [acetaminophen]  Social History Social History   Tobacco Use  . Smoking status: Current Every Day Smoker    Packs/day: 2.00    Types: Cigarettes  . Smokeless tobacco: Never Used  Substance Use Topics  . Alcohol use: No  . Drug use: No    Review of Systems Constitutional: Negative for fever. Cardiovascular: Negative for chest pain. Respiratory: Negative for shortness of breath. Gastrointestinal: Positive for abdominal pain Musculoskeletal: Negative for back pain. Skin: Negative for rash. Neurological: Negative for headaches, focal weakness or numbness.  All systems negative/normal/unremarkable except as stated in the HPI  ____________________________________________   PHYSICAL EXAM:  VITAL SIGNS: ED Triage Vitals  Enc Vitals Group     BP 06/17/19 1152 (!) 158/71     Pulse Rate 06/17/19 1152 (!) 58     Resp 06/17/19 1152 16     Temp 06/17/19 1152 98.2 F (36.8 C)     Temp Source 06/17/19 1152 Oral     SpO2 06/17/19 1152 99 %     Weight 06/17/19 1153 280 lb (127 kg)     Height 06/17/19 1153 5\' 4"  (1.626 m)     Head  Circumference --      Peak Flow --      Pain Score 06/17/19 1153 10     Pain Loc --      Pain Edu? --      Excl. in [place]? --    Constitutional: Alert and oriented. Well appearing and in no distress. Eyes: Conjunctivae are normal. Normal extraocular movements. Cardiovascular: Normal rate, regular rhythm. No murmurs, rubs, or gallops. Respiratory: Normal respiratory effort without tachypnea nor retractions. Breath sounds are clear and equal bilaterally. No wheezes/rales/rhonchi. Gastrointestinal: Left lower quadrant tenderness, no rebound or guarding.  Normal bowel sounds. Musculoskeletal: Nontender with normal range of motion in extremities. No lower extremity tenderness nor edema. Neurologic:  Normal speech and language. No gross focal neurologic deficits are appreciated.  Skin:  Skin is warm, dry and intact. No rash noted. Psychiatric: Mood and affect are normal. Speech and behavior are normal.  ____________________________________________  ED COURSE:  As part of my medical decision making, I reviewed the following data within the [place] History obtained from family if available, nursing notes, old chart and ekg, as well as notes from prior ED visits. Patient presented for left lower quadrant pain, we will assess with labs and imaging as indicated at this time.   Procedures  Victoria Kane was evaluated in Emergency Department on 06/17/2019 for the symptoms described in the history of present illness. She was evaluated in  the context of the global COVID-19 pandemic, which necessitated consideration that the patient might be at risk for infection with the SARS-CoV-2 virus that causes COVID-19. Institutional protocols and algorithms that pertain to the evaluation of patients at risk for COVID-19 are in a state of rapid change based on information released by regulatory bodies including the CDC and federal and state organizations. These policies and algorithms were followed  during the patient's care in the ED.  ____________________________________________   LABS (pertinent positives/negatives)  Labs Reviewed  COMPREHENSIVE METABOLIC PANEL - Abnormal; Notable for the following components:      Result Value   Glucose, Bld 122 (*)    All other components within normal limits  CBC - Abnormal; Notable for the following components:   Hemoglobin 15.2 (*)    HCT 46.3 (*)    All other components within normal limits  URINALYSIS, COMPLETE (UACMP) WITH MICROSCOPIC - Abnormal; Notable for the following components:   Color, Urine YELLOW (*)    APPearance HAZY (*)    Hgb urine dipstick MODERATE (*)    All other components within normal limits  LIPASE, BLOOD  POC URINE PREG, ED  POCT PREGNANCY, URINE    RADIOLOGY Images were viewed by me  CT renal protocol Reveals distal left ureteral stone ____________________________________________   DIFFERENTIAL DIAGNOSIS   Renal colic, UTI, pyelonephritis, muscle strain, diverticulitis  FINAL ASSESSMENT AND PLAN  Renal colic   Plan: The patient had presented for left flank pain. Patient's labs revealed hematuria but no other abnormality. Patient's imaging did reveal a distal left ureteral stone.  Kidney function and urinalysis is unremarkable with the exception of hematuria as expected.  She was given Toradol, Zofran and Flomax.  She is cleared for outpatient follow-up.   [name], MD    Note: This note was generated in part or whole with voice recognition software. Voice recognition is usually quite accurate but there are transcription errors that can and very often do occur. I apologize for any typographical errors that were not detected and corrected.     [name], MD 06/17/19 1438

## 2019-06-17 NOTE — ED Notes (Signed)
Pt c/o sharp lower abd pain - c/o freq and painful urination - she is having episodes of incontinence - she saw PCP last week and negative urine

## 2019-06-18 ENCOUNTER — Other Ambulatory Visit
Admission: RE | Admit: 2019-06-18 | Discharge: 2019-06-18 | Disposition: A | Discharge status: Home / Self Care | Payer: BLUE CROSS/BLUE SHIELD | Source: Ambulatory Visit | Attending: Urology | Admitting: Urology

## 2019-06-18 ENCOUNTER — Ambulatory Visit: Payer: BLUE CROSS/BLUE SHIELD

## 2019-06-18 ENCOUNTER — Encounter: Payer: Self-pay | Admitting: Physician Assistant

## 2019-06-18 ENCOUNTER — Encounter
Admission: RE | Disposition: A | Discharge status: Home / Self Care | Payer: Self-pay | Source: Home / Self Care | Attending: Urology

## 2019-06-18 ENCOUNTER — Encounter: Payer: Self-pay | Admitting: *Deleted

## 2019-06-18 ENCOUNTER — Other Ambulatory Visit: Payer: Self-pay | Admitting: Radiology

## 2019-06-18 ENCOUNTER — Ambulatory Visit (INDEPENDENT_AMBULATORY_CARE_PROVIDER_SITE_OTHER): Payer: BLUE CROSS/BLUE SHIELD | Admitting: Physician Assistant

## 2019-06-18 ENCOUNTER — Ambulatory Visit
Admission: RE | Admit: 2019-06-18 | Discharge: 2019-06-18 | Disposition: A | Discharge status: Home / Self Care | Payer: BLUE CROSS/BLUE SHIELD | Attending: Urology | Admitting: Urology

## 2019-06-18 VITALS — BP 128/79 | HR 64 | Ht 64.0 in | Wt 279.0 lb

## 2019-06-18 DIAGNOSIS — Z79899 Other long term (current) drug therapy: Secondary | ICD-10-CM | POA: Diagnosis not present

## 2019-06-18 DIAGNOSIS — Z20828 Contact with and (suspected) exposure to other viral communicable diseases: Secondary | ICD-10-CM | POA: Insufficient documentation

## 2019-06-18 DIAGNOSIS — E119 Type 2 diabetes mellitus without complications: Secondary | ICD-10-CM | POA: Diagnosis not present

## 2019-06-18 DIAGNOSIS — N132 Hydronephrosis with renal and ureteral calculous obstruction: Secondary | ICD-10-CM | POA: Insufficient documentation

## 2019-06-18 DIAGNOSIS — N201 Calculus of ureter: Secondary | ICD-10-CM

## 2019-06-18 DIAGNOSIS — F17210 Nicotine dependence, cigarettes, uncomplicated: Secondary | ICD-10-CM | POA: Diagnosis not present

## 2019-06-18 DIAGNOSIS — Z86718 Personal history of other venous thrombosis and embolism: Secondary | ICD-10-CM | POA: Diagnosis not present

## 2019-06-18 HISTORY — PX: EXTRACORPOREAL SHOCK WAVE LITHOTRIPSY: SHX1557

## 2019-06-18 LAB — SARS CORONAVIRUS 2 BY RT PCR (HOSPITAL ORDER, PERFORMED IN ~~LOC~~ HOSPITAL LAB): SARS Coronavirus 2: NEGATIVE

## 2019-06-18 SURGERY — LITHOTRIPSY, ESWL
Anesthesia: Moderate Sedation | Laterality: Left

## 2019-06-18 MED ORDER — DIPHENHYDRAMINE HCL 25 MG PO CAPS
ORAL_CAPSULE | ORAL | Status: AC
  Filled 2019-06-18: qty 1

## 2019-06-18 MED ORDER — SODIUM CHLORIDE 0.9 % IV SOLN: INTRAVENOUS | Status: DC

## 2019-06-18 MED ORDER — ONDANSETRON HCL 4 MG/2ML IJ SOLN
INTRAMUSCULAR | Status: AC
  Filled 2019-06-18: qty 2

## 2019-06-18 MED ORDER — DIPHENHYDRAMINE HCL 25 MG PO CAPS
25.0000 mg | ORAL_CAPSULE | ORAL | Status: AC
  Administered 2019-06-18: 25 mg via ORAL

## 2019-06-18 MED ORDER — OXYCODONE HCL 5 MG PO TABS: 5.0000 mg | ORAL_TABLET | ORAL | 0 refills | Status: DC | PRN

## 2019-06-18 MED ORDER — DIAZEPAM 5 MG PO TABS
ORAL_TABLET | ORAL | Status: AC
  Administered 2019-06-18: 10 mg via ORAL
  Filled 2019-06-18: qty 2

## 2019-06-18 MED ORDER — DIAZEPAM 5 MG PO TABS
10.0000 mg | ORAL_TABLET | ORAL | Status: AC
  Administered 2019-06-18: 14:00:00 10 mg via ORAL

## 2019-06-18 MED ORDER — ONDANSETRON HCL 4 MG/2ML IJ SOLN
4.0000 mg | Freq: Once | INTRAMUSCULAR | Status: AC | PRN
  Administered 2019-06-18: 14:00:00 4 mg via INTRAVENOUS

## 2019-06-18 MED ORDER — CIPROFLOXACIN HCL 500 MG PO TABS
500.0000 mg | ORAL_TABLET | ORAL | Status: AC
  Administered 2019-06-18: 14:00:00 500 mg via ORAL

## 2019-06-18 MED ORDER — CIPROFLOXACIN HCL 500 MG PO TABS
ORAL_TABLET | ORAL | Status: AC
  Filled 2019-06-18: qty 1

## 2019-06-18 NOTE — Discharge Instructions (Signed)
See [facility] discharge instructions in chart.  AMBULATORY SURGERY  DISCHARGE INSTRUCTIONS   1) The drugs that you were given will stay in your system until tomorrow so for the next 24 hours you should not:  A) Drive an automobile B) Make any legal decisions C) Drink any alcoholic beverage   2) You may resume regular meals tomorrow.  Today it is better to start with liquids and gradually work up to solid foods.  You may eat anything you prefer, but it is better to start with liquids, then soup and crackers, and gradually work up to solid foods.   3) Please notify your doctor immediately if you have any unusual bleeding, trouble breathing, redness and pain at the surgery site, drainage, fever, or pain not relieved by medication.    4) Additional Instructions:        Please contact your physician with any problems or Same Day Surgery at [phone], Monday through Friday 6 am to 4 pm, or [place] at  Main number at [phone].  

## 2019-06-18 NOTE — H&P (View-Only) (Signed)
06/18/2019 9:58 AM   [name] 11-12-1970 [id number]  CC: 29mm distal left ureteral stone, LLQ pain, microscopic hematuria  HPI: Victoria Kane is a 48 y.o. female who presents for management of known 96mm distal left ureteral stone.   She presented to the ED yesterday with a 1-day history of LLQ pain. CT stone study revealed a 66mm distal left ureteral stone with associated mild left hydronephrosis. Urinalysis with 11-20 RBCs/hpf but otherwise negative. WBC 7.6, creatinine 0.74.   She was discharged with Toradol, Zofran, and Flomax. She reports she has not taken any of these since yesterday.  In the interim, she reports improvement in her pain, which she now describes as bearable, and chills. She denies fever, nausea, vomiting, and gross hematuria.  Per CT, stone is measured at <700HU in density. Stone-to-skin distance estimated at 17cm. Stone is visible on scout imaging.  PMH: Past Medical History:  Diagnosis Date  . DVT (deep venous thrombosis) [facility])     Surgical History: Past Surgical History:  Procedure Laterality Date  . CESAREAN SECTION      Home Medications:  Allergies as of 06/18/2019      Reactions   Bee Pollen    Other    Poison ivy   Tylenol [acetaminophen] Other (See Comments)   Patient states, "They found Bilirubin in my system."      Medication List       Accurate as of June 18, 2019  9:58 AM. If you have any questions, ask your nurse or doctor.        STOP taking these medications   omeprazole 20 MG capsule Commonly known as: PRILOSEC Stopped by: Debroah Loop, PA-C     TAKE these medications   atorvastatin 20 MG tablet Commonly known as: LIPITOR Take 20 mg by mouth at bedtime.   ketorolac 10 MG tablet Commonly known as: TORADOL Take 1 tablet (10 mg total) by mouth every 6 (six) hours as needed.   ondansetron 4 MG disintegrating tablet Commonly known as: Zofran ODT Take 1 tablet (4 mg total) by mouth every 8 (eight)  hours as needed for nausea or vomiting.   tamsulosin 0.4 MG Caps capsule Commonly known as: FLOMAX Take 1 capsule (0.4 mg total) by mouth daily after breakfast.       Allergies:  Allergies  Allergen Reactions  . Bee Pollen   . Other     Poison ivy  . Tylenol [Acetaminophen] Other (See Comments)    Patient states, "They found Bilirubin in my system."     Family History: History reviewed. No pertinent family history.  Social History:  reports that she has been smoking cigarettes. She has been smoking about 2.00 packs per day. She has never used smokeless tobacco. She reports that she does not drink alcohol or use drugs.  ROS: UROLOGY Frequent Urination?: Yes Hard to postpone urination?: No Burning/pain with urination?: Yes Get up at night to urinate?: Yes Leakage of urine?: No Urine stream starts and stops?: No Trouble starting stream?: No Do you have to strain to urinate?: No Blood in urine?: Yes Urinary tract infection?: No Sexually transmitted disease?: No Injury to kidneys or bladder?: No Painful intercourse?: No Weak stream?: No Currently pregnant?: No Vaginal bleeding?: No Last menstrual period?: n  Gastrointestinal Nausea?: Yes Vomiting?: No Indigestion/heartburn?: No Diarrhea?: No Constipation?: No  Constitutional Fever: No Night sweats?: No Weight loss?: No Fatigue?: No  Skin Skin rash/lesions?: No Itching?: No  Eyes Blurred vision?: No Double vision?: No  Ears/Nose/Throat Sore throat?: No Sinus problems?: No  Hematologic/Lymphatic Swollen glands?: No Easy bruising?: No  Cardiovascular Leg swelling?: No Chest pain?: No  Respiratory Cough?: No Shortness of breath?: No  Endocrine Excessive thirst?: No  Musculoskeletal Back pain?: No Joint pain?: No  Neurological Headaches?: No Dizziness?: No  Psychologic Depression?: No Anxiety?: No  Physical Exam: BP 128/79 (BP Location: Left Arm, Patient Position: Sitting, Cuff  Size: Normal)   Pulse 64   Ht 5\' 4"  (1.626 m)   Wt 279 lb (126.6 kg)   BMI 47.89 kg/m   Constitutional:  Alert and oriented, No acute distress, comfortable appearing HEENT: [place] AT, moist mucus membranes Cardiovascular: No clubbing, cyanosis, or edema. Respiratory: Normal respiratory effort, no increased work of breathing. Skin: No rashes, bruises or suspicious lesions. Neurologic: Grossly intact, no focal deficits, moving all 4 extremities. Psychiatric: Normal mood and affect.  Laboratory Data: Results for orders placed or performed during the hospital encounter of 06/17/19  Lipase, blood  Result Value Ref Range   Lipase 24 11 - 51 U/L  Comprehensive metabolic panel  Result Value Ref Range   Sodium 139 135 - 145 mmol/L   Potassium 4.2 3.5 - 5.1 mmol/L   Chloride 106 98 - 111 mmol/L   CO2 24 22 - 32 mmol/L   Glucose, Bld 122 (H) 70 - 99 mg/dL   BUN 15 6 - 20 mg/dL   Creatinine, Ser 0.74 0.44 - 1.00 mg/dL   Calcium 9.0 8.9 - 10.3 mg/dL   Total Protein 7.7 6.5 - 8.1 g/dL   Albumin 4.4 3.5 - 5.0 g/dL   AST 18 15 - 41 U/L   ALT 17 0 - 44 U/L   Alkaline Phosphatase 86 38 - 126 U/L   Total Bilirubin 0.6 0.3 - 1.2 mg/dL   GFR calc non Af Amer >60 >60 mL/min   GFR calc Af Amer >60 >60 mL/min   Anion gap 9 5 - 15  CBC  Result Value Ref Range   WBC 7.6 4.0 - 10.5 K/uL   RBC 4.92 3.87 - 5.11 MIL/uL   Hemoglobin 15.2 (H) 12.0 - 15.0 g/dL   HCT 46.3 (H) 36.0 - 46.0 %   MCV 94.1 80.0 - 100.0 fL   MCH 30.9 26.0 - 34.0 pg   MCHC 32.8 30.0 - 36.0 g/dL   RDW 13.2 11.5 - 15.5 %   Platelets 237 150 - 400 K/uL   nRBC 0.0 0.0 - 0.2 %  Urinalysis, Complete w Microscopic  Result Value Ref Range   Color, Urine YELLOW (A) YELLOW   APPearance HAZY (A) CLEAR   Specific Gravity, Urine 1.017 1.005 - 1.030   pH 5.0 5.0 - 8.0   Glucose, UA NEGATIVE NEGATIVE mg/dL   Hgb urine dipstick MODERATE (A) NEGATIVE   Bilirubin Urine NEGATIVE NEGATIVE   Ketones, ur NEGATIVE NEGATIVE mg/dL   Protein,  ur NEGATIVE NEGATIVE mg/dL   Nitrite NEGATIVE NEGATIVE   Leukocytes,Ua NEGATIVE NEGATIVE   RBC / HPF 11-20 0 - 5 RBC/hpf   WBC, UA 0-5 0 - 5 WBC/hpf   Bacteria, UA NONE SEEN NONE SEEN   Squamous Epithelial / LPF 0-5 0 - 5   Mucus PRESENT   Pregnancy, urine POC  Result Value Ref Range   Preg Test, Ur NEGATIVE NEGATIVE   Pertinent Imaging: Results for orders placed during the hospital encounter of 06/17/19  CT Renal Stone Study   Narrative CLINICAL DATA:  Left lower quadrant abdominal pain. Microhematuria.  EXAM:  CT ABDOMEN AND PELVIS WITHOUT CONTRAST  TECHNIQUE: Multidetector CT imaging of the abdomen and pelvis was performed following the standard protocol without IV contrast.  COMPARISON:  None.  FINDINGS: Lower chest: Normal.  Hepatobiliary: Mild prominence of the right lobe of the liver. No focal lesions. Biliary tree is normal.  Pancreas: Unremarkable. No pancreatic ductal dilatation or surrounding inflammatory changes.  Spleen: Normal in size without focal abnormality.  Adrenals/Urinary Tract: There is a 7 mm stone obstructing the distal left ureter just proximal to the bladder. There is mild left hydronephrosis. No renal calculi. Adrenal glands and right kidney appear normal. Bladder appears normal.  Stomach/Bowel: Stomach is within normal limits. Appendix appears normal. No evidence of bowel wall thickening, distention, or inflammatory changes.  Vascular/Lymphatic: No significant vascular findings are present. No enlarged abdominal or pelvic lymph nodes.  Reproductive: Uterus and bilateral adnexa are unremarkable.  Other: No abdominal wall hernia or abnormality. No abdominopelvic ascites.  Musculoskeletal: No acute abnormality. 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  IMPRESSION: 7 mm stone obstructing the distal left ureter causing mild left hydronephrosis.   Electronically Signed   By: Lorriane Shire M.D.   On:  06/17/2019 14:40    I personally reviewed the images referenced above and note an elongated approximate 1mm distal left ureteral stone.  Assessment & Plan:   We discussed various treatment options for her stone including trial of passage vs. ESWL vs. ureteroscopy with laser lithotripsy and stent. We discussed the risks and benefits of each including bleeding, infection, damage to surrounding structures, efficacy with need for possible further intervention, and need for temporary ureteral stent.  With a stone of this size, I estimate the chance of spontaneous passage at approximately 30% over the next 4 weeks. I explained that with the stone's location, the stone-free rate is higher with ureteroscopy compared to ESWL.  Based on this conversation, patient would like to proceed with ESWL today. I am in agreement with this plan.  1. Left ureteral stone Not concerned for infection today, UA yesterday negative. Will order culture prior to ESWL in case she develops infective symptoms following procedure.  She will need to follow up in 2 weeks with K[facility] prior. - CULTURE, URINE COMPREHENSIVE  Debroah Loop, PA-C  [facility] [address], [place], [place] [address] [phone]

## 2019-06-18 NOTE — Progress Notes (Signed)
06/18/2019 9:58 AM   [name] Feb 23, 1971 [id number]  CC: 28mm distal left ureteral stone, LLQ pain, microscopic hematuria  HPI: Victoria Kane is a 48 y.o. female who presents for management of known 26mm distal left ureteral stone.   She presented to the ED yesterday with a 1-day history of LLQ pain. CT stone study revealed a 85mm distal left ureteral stone with associated mild left hydronephrosis. Urinalysis with 11-20 RBCs/hpf but otherwise negative. WBC 7.6, creatinine 0.74.   She was discharged with Toradol, Zofran, and Flomax. She reports she has not taken any of these since yesterday.  In the interim, she reports improvement in her pain, which she now describes as bearable, and chills. She denies fever, nausea, vomiting, and gross hematuria.  Per CT, stone is measured at <700HU in density. Stone-to-skin distance estimated at 17cm. Stone is visible on scout imaging.  PMH: Past Medical History:  Diagnosis Date  . DVT (deep venous thrombosis) [facility])     Surgical History: Past Surgical History:  Procedure Laterality Date  . CESAREAN SECTION      Home Medications:  Allergies as of 06/18/2019      Reactions   Bee Pollen    Other    Poison ivy   Tylenol [acetaminophen] Other (See Comments)   Patient states, "They found Bilirubin in my system."      Medication List       Accurate as of June 18, 2019  9:58 AM. If you have any questions, ask your nurse or doctor.        STOP taking these medications   omeprazole 20 MG capsule Commonly known as: PRILOSEC Stopped by: Debroah Loop, PA-C     TAKE these medications   atorvastatin 20 MG tablet Commonly known as: LIPITOR Take 20 mg by mouth at bedtime.   ketorolac 10 MG tablet Commonly known as: TORADOL Take 1 tablet (10 mg total) by mouth every 6 (six) hours as needed.   ondansetron 4 MG disintegrating tablet Commonly known as: Zofran ODT Take 1 tablet (4 mg total) by mouth every 8 (eight)  hours as needed for nausea or vomiting.   tamsulosin 0.4 MG Caps capsule Commonly known as: FLOMAX Take 1 capsule (0.4 mg total) by mouth daily after breakfast.       Allergies:  Allergies  Allergen Reactions  . Bee Pollen   . Other     Poison ivy  . Tylenol [Acetaminophen] Other (See Comments)    Patient states, "They found Bilirubin in my system."     Family History: History reviewed. No pertinent family history.  Social History:  reports that she has been smoking cigarettes. She has been smoking about 2.00 packs per day. She has never used smokeless tobacco. She reports that she does not drink alcohol or use drugs.  ROS: UROLOGY Frequent Urination?: Yes Hard to postpone urination?: No Burning/pain with urination?: Yes Get up at night to urinate?: Yes Leakage of urine?: No Urine stream starts and stops?: No Trouble starting stream?: No Do you have to strain to urinate?: No Blood in urine?: Yes Urinary tract infection?: No Sexually transmitted disease?: No Injury to kidneys or bladder?: No Painful intercourse?: No Weak stream?: No Currently pregnant?: No Vaginal bleeding?: No Last menstrual period?: n  Gastrointestinal Nausea?: Yes Vomiting?: No Indigestion/heartburn?: No Diarrhea?: No Constipation?: No  Constitutional Fever: No Night sweats?: No Weight loss?: No Fatigue?: No  Skin Skin rash/lesions?: No Itching?: No  Eyes Blurred vision?: No Double vision?: No  Ears/Nose/Throat Sore throat?: No Sinus problems?: No  Hematologic/Lymphatic Swollen glands?: No Easy bruising?: No  Cardiovascular Leg swelling?: No Chest pain?: No  Respiratory Cough?: No Shortness of breath?: No  Endocrine Excessive thirst?: No  Musculoskeletal Back pain?: No Joint pain?: No  Neurological Headaches?: No Dizziness?: No  Psychologic Depression?: No Anxiety?: No  Physical Exam: BP 128/79 (BP Location: Left Arm, Patient Position: Sitting, Cuff  Size: Normal)   Pulse 64   Ht 5\' 4"  (1.626 m)   Wt 279 lb (126.6 kg)   BMI 47.89 kg/m   Constitutional:  Alert and oriented, No acute distress, comfortable appearing HEENT: [place] AT, moist mucus membranes Cardiovascular: No clubbing, cyanosis, or edema. Respiratory: Normal respiratory effort, no increased work of breathing. Skin: No rashes, bruises or suspicious lesions. Neurologic: Grossly intact, no focal deficits, moving all 4 extremities. Psychiatric: Normal mood and affect.  Laboratory Data: Results for orders placed or performed during the hospital encounter of 06/17/19  Lipase, blood  Result Value Ref Range   Lipase 24 11 - 51 U/L  Comprehensive metabolic panel  Result Value Ref Range   Sodium 139 135 - 145 mmol/L   Potassium 4.2 3.5 - 5.1 mmol/L   Chloride 106 98 - 111 mmol/L   CO2 24 22 - 32 mmol/L   Glucose, Bld 122 (H) 70 - 99 mg/dL   BUN 15 6 - 20 mg/dL   Creatinine, Ser 0.74 0.44 - 1.00 mg/dL   Calcium 9.0 8.9 - 10.3 mg/dL   Total Protein 7.7 6.5 - 8.1 g/dL   Albumin 4.4 3.5 - 5.0 g/dL   AST 18 15 - 41 U/L   ALT 17 0 - 44 U/L   Alkaline Phosphatase 86 38 - 126 U/L   Total Bilirubin 0.6 0.3 - 1.2 mg/dL   GFR calc non Af Amer >60 >60 mL/min   GFR calc Af Amer >60 >60 mL/min   Anion gap 9 5 - 15  CBC  Result Value Ref Range   WBC 7.6 4.0 - 10.5 K/uL   RBC 4.92 3.87 - 5.11 MIL/uL   Hemoglobin 15.2 (H) 12.0 - 15.0 g/dL   HCT 46.3 (H) 36.0 - 46.0 %   MCV 94.1 80.0 - 100.0 fL   MCH 30.9 26.0 - 34.0 pg   MCHC 32.8 30.0 - 36.0 g/dL   RDW 13.2 11.5 - 15.5 %   Platelets 237 150 - 400 K/uL   nRBC 0.0 0.0 - 0.2 %  Urinalysis, Complete w Microscopic  Result Value Ref Range   Color, Urine YELLOW (A) YELLOW   APPearance HAZY (A) CLEAR   Specific Gravity, Urine 1.017 1.005 - 1.030   pH 5.0 5.0 - 8.0   Glucose, UA NEGATIVE NEGATIVE mg/dL   Hgb urine dipstick MODERATE (A) NEGATIVE   Bilirubin Urine NEGATIVE NEGATIVE   Ketones, ur NEGATIVE NEGATIVE mg/dL   Protein,  ur NEGATIVE NEGATIVE mg/dL   Nitrite NEGATIVE NEGATIVE   Leukocytes,Ua NEGATIVE NEGATIVE   RBC / HPF 11-20 0 - 5 RBC/hpf   WBC, UA 0-5 0 - 5 WBC/hpf   Bacteria, UA NONE SEEN NONE SEEN   Squamous Epithelial / LPF 0-5 0 - 5   Mucus PRESENT   Pregnancy, urine POC  Result Value Ref Range   Preg Test, Ur NEGATIVE NEGATIVE   Pertinent Imaging: Results for orders placed during the hospital encounter of 06/17/19  CT Renal Stone Study   Narrative CLINICAL DATA:  Left lower quadrant abdominal pain. Microhematuria.  EXAM:  CT ABDOMEN AND PELVIS WITHOUT CONTRAST  TECHNIQUE: Multidetector CT imaging of the abdomen and pelvis was performed following the standard protocol without IV contrast.  COMPARISON:  None.  FINDINGS: Lower chest: Normal.  Hepatobiliary: Mild prominence of the right lobe of the liver. No focal lesions. Biliary tree is normal.  Pancreas: Unremarkable. No pancreatic ductal dilatation or surrounding inflammatory changes.  Spleen: Normal in size without focal abnormality.  Adrenals/Urinary Tract: There is a 7 mm stone obstructing the distal left ureter just proximal to the bladder. There is mild left hydronephrosis. No renal calculi. Adrenal glands and right kidney appear normal. Bladder appears normal.  Stomach/Bowel: Stomach is within normal limits. Appendix appears normal. No evidence of bowel wall thickening, distention, or inflammatory changes.  Vascular/Lymphatic: No significant vascular findings are present. No enlarged abdominal or pelvic lymph nodes.  Reproductive: Uterus and bilateral adnexa are unremarkable.  Other: No abdominal wall hernia or abnormality. No abdominopelvic ascites.  Musculoskeletal: No acute abnormality. 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  IMPRESSION: 7 mm stone obstructing the distal left ureter causing mild left hydronephrosis.   Electronically Signed   By: Lorriane Shire M.D.   On:  06/17/2019 14:40    I personally reviewed the images referenced above and note an elongated approximate 49mm distal left ureteral stone.  Assessment & Plan:   We discussed various treatment options for her stone including trial of passage vs. ESWL vs. ureteroscopy with laser lithotripsy and stent. We discussed the risks and benefits of each including bleeding, infection, damage to surrounding structures, efficacy with need for possible further intervention, and need for temporary ureteral stent.  With a stone of this size, I estimate the chance of spontaneous passage at approximately 30% over the next 4 weeks. I explained that with the stone's location, the stone-free rate is higher with ureteroscopy compared to ESWL.  Based on this conversation, patient would like to proceed with ESWL today. I am in agreement with this plan.  1. Left ureteral stone Not concerned for infection today, UA yesterday negative. Will order culture prior to ESWL in case she develops infective symptoms following procedure.  She will need to follow up in 2 weeks with KUB prior. - CULTURE, URINE COMPREHENSIVE  Debroah Loop, PA-C  [facility] [address], [place], [place] [address] [phone]

## 2019-06-19 NOTE — Interval H&P Note (Signed)
History and Physical Interval Note:  06/19/2019 8:31 AM  Victoria Kane  has presented today for surgery, with the diagnosis of Kidney stone.  The various methods of treatment have been discussed with the patient and family. After consideration of risks, benefits and other options for treatment, the patient has consented to  Procedure(s): EXTRACORPOREAL SHOCK WAVE LITHOTRIPSY (ESWL) (Left) as a surgical intervention.  The patient's history has been reviewed, patient examined, no change in status, stable for surgery.  I have reviewed the patient's chart and labs.  Questions were answered to the patient's satisfaction.     Hollice Espy

## 2019-06-21 LAB — CULTURE, URINE COMPREHENSIVE

## 2019-06-22 ENCOUNTER — Telehealth: Payer: Self-pay | Admitting: Physician Assistant

## 2019-06-22 NOTE — Telephone Encounter (Signed)
Spoke with the patient and her daughter via telephone.  Patient reports taking one 5 mg oxycodone approximately 1 hour ago.  She had not taken oxycodone for several days leading up to this.  She reports taking the rest of her MET medications as prescribed.  She reports chills but is afebrile.  I suspect she is passing some retained stone fragments.  I advised her that she can take 1 additional oxycodone 5 mg tablet for pain relief.  I reminded her not to drive or operate heavy machinery while she does this.  I advised her to continue to stay very well-hydrated.  I also recommended the use of warm baths for symptom palliation.  I did note that she should not be unsupervised in the bath while on a larger dose of oxycodone.  She expressed understanding.  I advised her to call back to the office if her pain is not relieved on 10 mg oxycodone.

## 2019-06-22 NOTE — Telephone Encounter (Signed)
Pt calling asking to speak to Victoria Kane, pt states she is in really bad pain, with hot and cold flashes. Denies fever. Fever checked while on phone and was 96.4.  Pt states she passed some stone fragments first 2 days after procedure, now urine is clear of fragments.  Pt daughter states pt just took a 5 mg oxycodone. Please advise @ [phone].

## 2019-06-23 ENCOUNTER — Telehealth: Payer: Self-pay | Admitting: Physician Assistant

## 2019-06-23 NOTE — Telephone Encounter (Signed)
Pt called and states that the medicine is controlling the pain but she only has one left. She wants to know if she should continue to take the Ketorolac. Please advise.

## 2019-06-25 NOTE — Telephone Encounter (Signed)
I spoke with the patient via telephone.  She states that she has passed several fragments since I last spoke with her 3 days ago.  She states her pain has since resolved and she feels much better.  I advised her to continue tamsulosin until her follow-up visit with me in 1 week.  I counseled her that she does not need to take her pain medications, ketorolac and oxycodone, unless she is having pain.  She expressed understanding.  No refill needed.

## 2019-07-02 ENCOUNTER — Ambulatory Visit
Admission: RE | Admit: 2019-07-02 | Discharge: 2019-07-02 | Disposition: A | Discharge status: Home / Self Care | Payer: BLUE CROSS/BLUE SHIELD | Source: Ambulatory Visit | Attending: Physician Assistant | Admitting: Physician Assistant

## 2019-07-02 ENCOUNTER — Ambulatory Visit (INDEPENDENT_AMBULATORY_CARE_PROVIDER_SITE_OTHER): Payer: BLUE CROSS/BLUE SHIELD | Admitting: Physician Assistant

## 2019-07-02 ENCOUNTER — Other Ambulatory Visit: Payer: Self-pay

## 2019-07-02 ENCOUNTER — Encounter: Payer: Self-pay | Admitting: Physician Assistant

## 2019-07-02 VITALS — BP 123/73 | HR 63 | Ht 65.0 in | Wt 274.0 lb

## 2019-07-02 DIAGNOSIS — N201 Calculus of ureter: Secondary | ICD-10-CM

## 2019-07-02 MED ORDER — TAMSULOSIN HCL 0.4 MG PO CAPS: 0.4000 mg | ORAL_CAPSULE | Freq: Every day | ORAL | 0 refills | Status: DC

## 2019-07-02 NOTE — Progress Notes (Signed)
07/02/2019 3:35 PM   Victoria Kane January 21, 1971 [id number]  CC: Postop ESWL  HPI: Victoria Kane is a 48 y.o. female who presents for postoperative evaluation s/p ESWL with Dr. Erlene Quan on 06/18/2019.  She presented to the ED on 06/17/2019 with a 1-day history of LLQ pain. CT stone study revealed a 7 mm distal left ureteral stone with associated mild left hydronephrosis. Urinalysis with 11-20 RBCs/hpf but otherwise negative.  WBC 7.6, creatinine 0.74. Urine culture negative.  She subsequently underwent ESWL with Dr. Erlene Quan on 06/18/2019. There were no complications.  Stone appeared to smudge following the procedure.  In the interim, she reports complete resolution of her pain and having passed several stone fragments, which she brought with her today for analysis. She denies fevers, chills, and gross hematuria.  Follow-up KUB today revealed persistence of the left distal ureteral stone, appearing slightly smaller than the original stone.  PMH: Past Medical History:  Diagnosis Date  . DVT (deep venous thrombosis) [facility])     Surgical History: Past Surgical History:  Procedure Laterality Date  . CESAREAN SECTION    . EXTRACORPOREAL SHOCK WAVE LITHOTRIPSY Left 06/18/2019   Procedure: EXTRACORPOREAL SHOCK WAVE LITHOTRIPSY (ESWL);  Surgeon: Hollice Espy, MD;  Location: [facility] ORS;  Service: Urology;  Laterality: Left;    Home Medications:  Allergies as of 07/02/2019      Reactions   Bee Pollen    Other    Poison ivy   Tylenol [acetaminophen] Other (See Comments)   Patient states, "They found Bilirubin in my system."      Medication List       Accurate as of July 02, 2019  3:35 PM. If you have any questions, ask your nurse or doctor.        STOP taking these medications   ketorolac 10 MG tablet Commonly known as: TORADOL Stopped by: Debroah Loop, PA-C   ondansetron 4 MG disintegrating tablet Commonly known as: Zofran ODT Stopped by: Debroah Loop,  PA-C   oxyCODONE 5 MG immediate release tablet Commonly known as: Roxicodone Stopped by: Debroah Loop, PA-C     TAKE these medications   atorvastatin 20 MG tablet Commonly known as: LIPITOR Take 20 mg by mouth at bedtime.   tamsulosin 0.4 MG Caps capsule Commonly known as: FLOMAX Take 1 capsule (0.4 mg total) by mouth daily. What changed: when to take this Changed by: Debroah Loop, PA-C       Allergies:  Allergies  Allergen Reactions  . Bee Pollen   . Other     Poison ivy  . Tylenol [Acetaminophen] Other (See Comments)    Patient states, "They found Bilirubin in my system."     Family History: No family history on file.  Social History:  reports that she has been smoking cigarettes. She has been smoking about 2.00 packs per day. She has never used smokeless tobacco. She reports that she does not drink alcohol or use drugs.  ROS: UROLOGY Frequent Urination?: No Hard to postpone urination?: No Burning/pain with urination?: No Get up at night to urinate?: No Leakage of urine?: No Urine stream starts and stops?: No Trouble starting stream?: No Do you have to strain to urinate?: No Blood in urine?: No Urinary tract infection?: No Sexually transmitted disease?: No Injury to kidneys or bladder?: No Painful intercourse?: No Weak stream?: No Currently pregnant?: No Vaginal bleeding?: No Last menstrual period?: n  Gastrointestinal Nausea?: No Vomiting?: No Indigestion/heartburn?: No Diarrhea?: No Constipation?: No  Constitutional Fever: No Night  sweats?: No Weight loss?: No Fatigue?: No  Skin Skin rash/lesions?: No Itching?: No  Eyes Blurred vision?: No Double vision?: No  Ears/Nose/Throat Sore throat?: No Sinus problems?: No  Hematologic/Lymphatic Swollen glands?: No Easy bruising?: No  Cardiovascular Leg swelling?: No Chest pain?: No  Respiratory Cough?: No Shortness of breath?: No  Endocrine Excessive thirst?:  No  Musculoskeletal Back pain?: No Joint pain?: No  Neurological Headaches?: No Dizziness?: No  Psychologic Depression?: No Anxiety?: No  Physical Exam: BP 123/73   Pulse 63   Ht 5\' 5"  (1.651 m)   Wt 274 lb (124.3 kg)   LMP 06/30/2019   BMI 45.60 kg/m   Constitutional:  Alert and oriented, No acute distress. HEENT: [place] AT, moist mucus membranes.  Trachea midline, no masses. Cardiovascular: No clubbing, cyanosis, or edema. Respiratory: Normal respiratory effort, no increased work of breathing. Skin: No rashes, bruises or suspicious lesions. Neurologic: Grossly intact, no focal deficits, moving all 4 extremities. Psychiatric: Normal mood and affect.  Pertinent Imaging: KUB 07/02/2019: CLINICAL DATA:  Pt states she had a kidney stone on her left side, three weeks ago  EXAM: ABDOMEN - 1 VIEW  COMPARISON:  06/18/2019.  CT, 06/17/2019.  FINDINGS: Elongated, 6 mm, calculus projects in the left lower pelvis, stable from the prior radiographs representing the distal ureteral stone noted on the prior CT. This is unchanged in position.  No evidence of a new ureteral stone. No visualized intrarenal stones.  Normal bowel gas pattern.  IMPRESSION: 1. Persistent elongated 6 mm stone in the distal left ureter. No change from the prior exams.   Electronically Signed   By: Lajean Manes M.D.   On: 07/02/2019 16:04  I personally reviewed the images referenced above.  While I agree that there is is the persistence of the distal left ureteral stone, per my review I believe it is slightly smaller than before and with some adjacent fragments present.  Assessment & Plan:   1. Left ureteral stone Patient with some retained stone following ESWL 2 weeks ago.  Patient reports complete symptom resolution.  I would like to give her an additional 2 weeks to pass residual fragments.  Tamsulosin represcribed.  I counseled her to continue with increased p.o. fluid intake.  She  expressed understanding. - DG Abd 1 View; Future - tamsulosin (FLOMAX) 0.4 MG CAPS capsule; Take 1 capsule (0.4 mg total) by mouth daily.  Dispense: 30 capsule; Refill: 0  Return in about 2 weeks (around 07/16/2019) for Postop f/u with KUB prior.  Debroah Loop, PA-C  [facility] [address] [place], [place] [address] [phone]

## 2019-07-03 ENCOUNTER — Other Ambulatory Visit: Payer: Self-pay | Admitting: Physician Assistant

## 2019-07-03 DIAGNOSIS — N201 Calculus of ureter: Secondary | ICD-10-CM

## 2019-07-03 MED ORDER — TAMSULOSIN HCL 0.4 MG PO CAPS: 0.4000 mg | ORAL_CAPSULE | Freq: Every day | ORAL | 0 refills | Status: DC

## 2019-07-03 NOTE — Telephone Encounter (Signed)
I re sent the RX. Pharmacy states they did not get the script.

## 2019-07-03 NOTE — Telephone Encounter (Signed)
Pt called and states that the medicine that was supposed to be called in yesterday is still not at [facility]. She didn't know the name of it.

## 2019-07-16 ENCOUNTER — Ambulatory Visit: Payer: BLUE CROSS/BLUE SHIELD | Admitting: Urology

## 2019-07-16 ENCOUNTER — Encounter: Payer: Self-pay | Admitting: Urology

## 2019-07-20 ENCOUNTER — Other Ambulatory Visit: Payer: Self-pay | Admitting: Urology

## 2019-07-20 NOTE — Progress Notes (Signed)
07/21/2019 9:40 AM   Victoria Kane 1970/10/24 [id number]  CC: Postop ESWL  HPI: Victoria Kane is a 48 y.o. female who presents for postoperative evaluation s/p ESWL with Dr. Erlene Quan on 06/18/2019.  She presented to the ED on 06/17/2019 with a 1-day history of LLQ pain. CT stone study revealed a 7 mm distal left ureteral stone with associated mild left hydronephrosis. Urinalysis with 11-20 RBCs/hpf but otherwise negative.  WBC 7.6, creatinine 0.74. Urine culture negative.  She subsequently underwent ESWL with Dr. Erlene Quan on 06/18/2019. There were no complications.  Stone appeared to smudge following the procedure.  In the interim, she reports complete resolution of her pain and having passed several stone fragments, which she brought with her today for analysis. She denies fevers, chills, and gross hematuria.  Follow-up KUB 07/02/2019 revealed persistence of the left distal ureteral stone, appearing slightly smaller than the original stone.  Today, she is feeling well.  She has no renal colic.  Patient denies any gross hematuria, dysuria or suprapubic/flank pain.  Patient denies any fevers, chills, nausea or vomiting.   Her stone analysis was 15% CaOx Monohydrate, 13% CaOx Dihydrate, 5% Ca Phos Carbonate and 8% Magnesium Ammonium Phosphate.    KUB 07/21/2019 revealed a distal left ureteral stone is unchanged in position.    PMH: Past Medical History:  Diagnosis Date  . DVT (deep venous thrombosis) [facility])     Surgical History: Past Surgical History:  Procedure Laterality Date  . CESAREAN SECTION    . EXTRACORPOREAL SHOCK WAVE LITHOTRIPSY Left 06/18/2019   Procedure: EXTRACORPOREAL SHOCK WAVE LITHOTRIPSY (ESWL);  Surgeon: Hollice Espy, MD;  Location: [facility] ORS;  Service: Urology;  Laterality: Left;    Home Medications:  Allergies as of 07/21/2019      Reactions   Bee Pollen    Other    Poison ivy   Tylenol [acetaminophen] Other (See Comments)   Patient states, "They found  Bilirubin in my system."      Medication List       Accurate as of July 21, 2019  9:40 AM. If you have any questions, ask your nurse or doctor.        STOP taking these medications   tamsulosin 0.4 MG Caps capsule Commonly known as: FLOMAX Stopped by: Karlina Suares, PA-C     TAKE these medications   atorvastatin 20 MG tablet Commonly known as: LIPITOR Take 20 mg by mouth at bedtime.       Allergies:  Allergies  Allergen Reactions  . Bee Pollen   . Other     Poison ivy  . Tylenol [Acetaminophen] Other (See Comments)    Patient states, "They found Bilirubin in my system."     Family History: No family history on file.  Social History:  reports that she has been smoking cigarettes. She has been smoking about 2.00 packs per day. She has never used smokeless tobacco. She reports that she does not drink alcohol or use drugs.  ROS: UROLOGY Frequent Urination?: No Hard to postpone urination?: No Burning/pain with urination?: No Get up at night to urinate?: No Leakage of urine?: No Urine stream starts and stops?: No Trouble starting stream?: No Do you have to strain to urinate?: No Blood in urine?: No Urinary tract infection?: No Sexually transmitted disease?: No Injury to kidneys or bladder?: No Painful intercourse?: No Weak stream?: No Currently pregnant?: No Vaginal bleeding?: No Last menstrual period?: n  Gastrointestinal Nausea?: No Vomiting?: No Indigestion/heartburn?: No Diarrhea?: No Constipation?: No  Constitutional Fever: No Night sweats?: No Weight loss?: No Fatigue?: No  Skin Skin rash/lesions?: No Itching?: No  Eyes Blurred vision?: No Double vision?: No  Ears/Nose/Throat Sore throat?: No Sinus problems?: No  Hematologic/Lymphatic Swollen glands?: No Easy bruising?: No  Cardiovascular Leg swelling?: No Chest pain?: No  Respiratory Cough?: No Shortness of breath?: No  Endocrine Excessive thirst?: No   Musculoskeletal Back pain?: No Joint pain?: No  Neurological Headaches?: No Dizziness?: No  Psychologic Depression?: No Anxiety?: No  Physical Exam: BP 121/73   Pulse 66   Ht '5\' 4"'  (1.626 m)   Wt 274 lb (124.3 kg)   LMP 06/30/2019   BMI 47.03 kg/m   Constitutional:  Well nourished. Alert and oriented, No acute distress. HEENT: [place] AT, moist mucus membranes.  Trachea midline, no masses. Cardiovascular: No clubbing, cyanosis, or edema. Respiratory: Normal respiratory effort, no increased work of breathing. Neurologic: Grossly intact, no focal deficits, moving all 4 extremities. Psychiatric: Normal mood and affect.   Pertinent Imaging: CLINICAL DATA:  Previous left-sided stone.  ESWL 1 month ago.  EXAM: ABDOMEN - 1 VIEW  COMPARISON:  07/02/2019.  06/18/2019.  CT 06/17/2019.  FINDINGS: Previously identified distal left ureteral stone is unchanged in position. Soft tissues of the abdomen are unremarkable. No bowel distention or free air. Degenerative change thoracolumbar spine and both hips.  IMPRESSION: Distal left ureteral stone is unchanged in position.   Electronically Signed   By: Marcello Moores  Register   On: 07/21/2019 08:52 I have independently reviewed the films and appreciate the left UVJ stone  Assessment & Plan:    1. Left ureteral stone Offered patient ureteroscopy for definitive treatment of her left UVJ stone, she deferred Offered patient repeated treatment of ESWL, she deferred Explained we up to 6 weeks for her to pursue MET before we need to intervene with a procedure in order to avoid permanent renal damage She will have a RUS and return in 2 weeks Will refer the tamsulosin 0.4 mg daily   Return in about 2 weeks (around 08/04/2019) for RUS report .  Zara Council, PA-C  [facility] [address], [place], [place] [address] [phone]

## 2019-07-21 ENCOUNTER — Ambulatory Visit (INDEPENDENT_AMBULATORY_CARE_PROVIDER_SITE_OTHER): Payer: BLUE CROSS/BLUE SHIELD | Admitting: Urology

## 2019-07-21 ENCOUNTER — Ambulatory Visit
Admission: RE | Admit: 2019-07-21 | Discharge: 2019-07-21 | Disposition: A | Discharge status: Home / Self Care | Payer: BLUE CROSS/BLUE SHIELD | Source: Ambulatory Visit | Attending: Physician Assistant | Admitting: Physician Assistant

## 2019-07-21 ENCOUNTER — Other Ambulatory Visit: Payer: Self-pay

## 2019-07-21 ENCOUNTER — Encounter: Payer: Self-pay | Admitting: Urology

## 2019-07-21 VITALS — BP 121/73 | HR 66 | Ht 64.0 in | Wt 274.0 lb

## 2019-07-21 DIAGNOSIS — N201 Calculus of ureter: Secondary | ICD-10-CM

## 2019-07-21 MED ORDER — TAMSULOSIN HCL 0.4 MG PO CAPS: 0.4000 mg | ORAL_CAPSULE | Freq: Every day | ORAL | 0 refills | Status: DC

## 2019-07-30 ENCOUNTER — Other Ambulatory Visit: Payer: Self-pay

## 2019-07-30 ENCOUNTER — Ambulatory Visit
Admission: RE | Admit: 2019-07-30 | Discharge: 2019-07-30 | Disposition: A | Discharge status: Home / Self Care | Payer: BLUE CROSS/BLUE SHIELD | Source: Ambulatory Visit | Attending: Urology | Admitting: Urology

## 2019-07-30 DIAGNOSIS — N201 Calculus of ureter: Secondary | ICD-10-CM

## 2019-08-03 NOTE — Progress Notes (Signed)
08/04/2019 9:20 AM   Victoria Kane 03/01/1971 [id number]  CC: Postop ESWL  HPI: Victoria Kane is a 48 y.o. female who presents to discuss results of her RUS.  She is s/p ESWL with Dr. Erlene Quan on 06/18/2019.  She presented to the ED on 06/17/2019 with a 1-day history of LLQ pain. CT stone study revealed a 7 mm distal left ureteral stone with associated mild left hydronephrosis. Urinalysis with 11-20 RBCs/hpf but otherwise negative.  WBC 7.6, creatinine 0.74. Urine culture negative.  She subsequently underwent ESWL with Dr. Erlene Quan on 06/18/2019. There were no complications.  Stone appeared to smudge following the procedure.  Follow-up KUB 07/02/2019 revealed persistence of the left distal ureteral stone, appearing slightly smaller than the original stone.  Her stone analysis was 15% CaOx Monohydrate, 13% CaOx Dihydrate, 5% Ca Phos Carbonate and 8% Magnesium Ammonium Phosphate.    KUB 07/21/2019 revealed a distal left ureteral stone is unchanged in position.    RUS 07/30/2019 revealed no acute or focal abnormality identified.  No hydronephrosis.  Her UA today is negative for microhematuria and calcium oxalate crystals.    She is not having any flank pain or difficulty with urination.  Patient denies any gross hematuria, dysuria or suprapubic/flank pain.  Patient denies any fevers, chills, nausea or vomiting.    PMH: Past Medical History:  Diagnosis Date  . DVT (deep venous thrombosis) [facility])     Surgical History: Past Surgical History:  Procedure Laterality Date  . CESAREAN SECTION    . EXTRACORPOREAL SHOCK WAVE LITHOTRIPSY Left 06/18/2019   Procedure: EXTRACORPOREAL SHOCK WAVE LITHOTRIPSY (ESWL);  Surgeon: Hollice Espy, MD;  Location: [facility] ORS;  Service: Urology;  Laterality: Left;    Home Medications:  Allergies as of 08/04/2019      Reactions   Bee Pollen    Other    Poison ivy   Tylenol [acetaminophen] Other (See Comments)   Patient states, "They found Bilirubin  in my system."      Medication List       Accurate as of August 04, 2019  9:20 AM. If you have any questions, ask your nurse or doctor.        atorvastatin 20 MG tablet Commonly known as: LIPITOR Take 20 mg by mouth at bedtime.   tamsulosin 0.4 MG Caps capsule Commonly known as: FLOMAX Take 0.4 mg by mouth daily.       Allergies:  Allergies  Allergen Reactions  . Bee Pollen   . Other     Poison ivy  . Tylenol [Acetaminophen] Other (See Comments)    Patient states, "They found Bilirubin in my system."     Family History: No family history on file.  Social History:  reports that she has been smoking cigarettes. She has been smoking about 2.00 packs per day. She has never used smokeless tobacco. She reports that she does not drink alcohol or use drugs.  ROS: UROLOGY Frequent Urination?: No Hard to postpone urination?: No Burning/pain with urination?: No Get up at night to urinate?: No Leakage of urine?: No Urine stream starts and stops?: No Trouble starting stream?: No Do you have to strain to urinate?: No Blood in urine?: No Urinary tract infection?: No Sexually transmitted disease?: No Injury to kidneys or bladder?: No Painful intercourse?: No Weak stream?: No Currently pregnant?: No Vaginal bleeding?: No Last menstrual period?: n  Gastrointestinal Nausea?: No Vomiting?: No Indigestion/heartburn?: No Diarrhea?: No Constipation?: No  Constitutional Fever: No Night sweats?: No Weight loss?: No  Fatigue?: No  Skin Skin rash/lesions?: No Itching?: No  Eyes Blurred vision?: No Double vision?: No  Ears/Nose/Throat Sore throat?: No Sinus problems?: No  Hematologic/Lymphatic Swollen glands?: No Easy bruising?: No  Cardiovascular Leg swelling?: No Chest pain?: No  Respiratory Cough?: No Shortness of breath?: No  Endocrine Excessive thirst?: No  Musculoskeletal Back pain?: No Joint pain?: No  Neurological Headaches?: No  Dizziness?: No  Psychologic Depression?: No Anxiety?: No  Physical Exam: BP 128/77   Pulse 63   Ht 5\' 4"  (1.626 m)   Wt 277 lb (125.6 kg)   BMI 47.55 kg/m   Constitutional:  Well nourished. Alert and oriented, No acute distress. HEENT: [place] AT, moist mucus membranes.  Trachea midline, no masses. Cardiovascular: No clubbing, cyanosis, or edema. Respiratory: Normal respiratory effort, no increased work of breathing. Neurologic: Grossly intact, no focal deficits, moving all 4 extremities. Psychiatric: Normal mood and affect.   Pertinent Imaging: CLINICAL DATA:  Kidney stone left ureter on prior imaging. History of prior lithotripsy and passing stone fragments.  EXAM: RENAL / URINARY TRACT ULTRASOUND COMPLETE  COMPARISON:  CT 06/17/2019.  Ultrasound 02/10/2016.  FINDINGS: Right Kidney:  Renal measurements: 12.4 x 3.9 x 4.1 cm = volume: 120.5 mL . Echogenicity within normal limits. No mass or hydronephrosis visualized.  Left Kidney:  Renal measurements: 12.5 x 5.2 x 5.6 cm = volume: 189.5 mL. Echogenicity within normal limits. No mass or hydronephrosis visualized.  Bladder:  Appears normal for degree of bladder distention.  Other:  None.  IMPRESSION No acute or focal abnormality identified.  No hydronephrosis.   Electronically Signed   By: Marcello Moores  Register   On: 07/30/2019 11:36 I have independently reviewed the films and do not appreciate hydronephrosis  Assessment & Plan:    1. Left ureteral stone No hydro seen on recent RUS UA is negative for microscopic hematuria and calcium oxalate crystals Will have patient return in three months for follow up KUB as stone fragments were still visible on last weeks KUB - I expressed my concern that if fragments remain it may cause damage to her left kidney Patient is advised that if they should start to experience pain that is not able to be controlled with pain medication, intractable nausea and/or  vomiting and/or fevers greater than 103 or shaking chills to contact the office immediately or seek treatment in the emergency department for emergent intervention.    Return in about 3 months (around 11/04/2019) for KUB and office visit .  Zara Council, PA-C  [facility] [address] [place], [place] [address] [phone]

## 2019-08-04 ENCOUNTER — Ambulatory Visit (INDEPENDENT_AMBULATORY_CARE_PROVIDER_SITE_OTHER): Payer: BLUE CROSS/BLUE SHIELD | Admitting: Urology

## 2019-08-04 ENCOUNTER — Encounter: Payer: Self-pay | Admitting: Urology

## 2019-08-04 ENCOUNTER — Other Ambulatory Visit: Payer: Self-pay

## 2019-08-04 VITALS — BP 128/77 | HR 63 | Ht 64.0 in | Wt 277.0 lb

## 2019-08-04 DIAGNOSIS — N201 Calculus of ureter: Secondary | ICD-10-CM

## 2019-08-04 LAB — URINALYSIS, COMPLETE
Bilirubin, UA: NEGATIVE
Glucose, UA: NEGATIVE
Ketones, UA: NEGATIVE
Leukocytes,UA: NEGATIVE
Nitrite, UA: NEGATIVE
Protein,UA: NEGATIVE
Specific Gravity, UA: 1.025 (ref 1.005–1.030)
Urobilinogen, Ur: 0.2 mg/dL (ref 0.2–1.0)
pH, UA: 6.5 (ref 5.0–7.5)

## 2019-08-04 LAB — MICROSCOPIC EXAMINATION

## 2019-08-07 ENCOUNTER — Other Ambulatory Visit: Payer: Self-pay | Admitting: Otolaryngology

## 2019-08-07 ENCOUNTER — Other Ambulatory Visit (HOSPITAL_COMMUNITY): Payer: Self-pay | Admitting: Otolaryngology

## 2019-08-07 DIAGNOSIS — D110 Benign neoplasm of parotid gland: Secondary | ICD-10-CM

## 2019-08-14 ENCOUNTER — Other Ambulatory Visit: Payer: Self-pay

## 2019-08-14 ENCOUNTER — Ambulatory Visit
Admission: RE | Admit: 2019-08-14 | Discharge: 2019-08-14 | Disposition: A | Discharge status: Home / Self Care | Payer: BLUE CROSS/BLUE SHIELD | Source: Ambulatory Visit | Attending: Otolaryngology | Admitting: Otolaryngology

## 2019-08-14 DIAGNOSIS — D110 Benign neoplasm of parotid gland: Secondary | ICD-10-CM | POA: Diagnosis not present

## 2019-08-14 MED ORDER — IOHEXOL 300 MG/ML  SOLN
75.0000 mL | Freq: Once | INTRAMUSCULAR | Status: AC | PRN
  Administered 2019-08-14: 75 mL via INTRAVENOUS

## 2019-08-17 ENCOUNTER — Other Ambulatory Visit: Payer: Self-pay | Admitting: Otolaryngology

## 2019-08-17 DIAGNOSIS — K118 Other diseases of salivary glands: Secondary | ICD-10-CM

## 2019-08-18 NOTE — Progress Notes (Signed)
Patient for parotid mass biopsy 08/21/2019, pre procedure instructions given with questions answered. Aware to be NPO after MN prior to procedure along with be here @1000 . States understanding.

## 2019-08-19 ENCOUNTER — Other Ambulatory Visit: Payer: Self-pay | Admitting: Radiology

## 2019-08-21 ENCOUNTER — Ambulatory Visit
Admission: RE | Admit: 2019-08-21 | Discharge: 2019-08-21 | Disposition: A | Discharge status: Home / Self Care | Payer: BLUE CROSS/BLUE SHIELD | Source: Ambulatory Visit | Attending: Otolaryngology | Admitting: Otolaryngology

## 2019-08-21 ENCOUNTER — Other Ambulatory Visit: Payer: Self-pay

## 2019-08-21 DIAGNOSIS — K118 Other diseases of salivary glands: Secondary | ICD-10-CM | POA: Diagnosis not present

## 2019-08-21 MED ORDER — SODIUM CHLORIDE 0.9 % IV SOLN: INTRAVENOUS | Status: DC

## 2019-08-21 NOTE — Discharge Instructions (Signed)
Needle Biopsy, Care After °This sheet gives you information about how to care for yourself after your procedure. Your health care provider may also give you more specific instructions. If you have problems or questions, contact your health care provider. °What can I expect after the procedure? °After the procedure, it is common to have soreness, bruising, or mild pain at the puncture site. This should go away in a few days. °Follow these instructions at home: °Needle insertion site care ° °· Wash your hands with soap and water before you change your bandage (dressing). If you cannot use soap and water, use hand sanitizer. °· Follow instructions from your health care provider about how to take care of your puncture site. This includes: °? When and how to change your dressing. °? When to remove your dressing. °· Check your puncture site every day for signs of infection. Check for: °? Redness, swelling, or pain. °? Fluid or blood. °? Pus or a bad smell. °? Warmth. °General instructions °· Return to your normal activities as told by your health care provider. Ask your health care provider what activities are safe for you. °· Do not take baths, swim, or use a hot tub until your health care provider approves. Ask your health care provider if you may take showers. You may only be allowed to take sponge baths. °· Take over-the-counter and prescription medicines only as told by your health care provider. °· Keep all follow-up visits as told by your health care provider. This is important. °Contact a health care provider if: °· You have a fever. °· You have redness, swelling, or pain at the puncture site that lasts longer than a few days. °· You have fluid, blood, or pus coming from your puncture site. °· Your puncture site feels warm to the touch. °Get help right away if: °· You have severe bleeding from the puncture site. °Summary °· After the procedure, it is common to have soreness, bruising, or mild pain at the puncture  site. This should go away in a few days. °· Check your puncture site every day for signs of infection, such as redness, swelling, or pain. °· Get help right away if you have severe bleeding from your puncture site. °This information is not intended to replace advice given to you by your health care provider. Make sure you discuss any questions you have with your health care provider. °Document Released: 02/01/2015 Document Revised: 11/29/2017 Document Reviewed: 09/30/2017 °Elsevier Patient Education © 2020 [employer] Inc[employer] ° °

## 2019-08-21 NOTE — Procedures (Signed)
Interventional Radiology Procedure Note  Procedure: US Guided Biopsy of left parotid mass  Complications: None  Estimated Blood Loss: < 10 mL  Findings: 25 G FNA biopsy of left parotid mass performed under US guidance.  Five samples obtained for cytologic analysis.  Venetia Night. Kathlene Cote, M.D Pager:  [phone]

## 2019-08-24 LAB — CYTOLOGY - NON PAP

## 2019-11-04 ENCOUNTER — Ambulatory Visit
Admission: RE | Admit: 2019-11-04 | Discharge: 2019-11-04 | Disposition: A | Discharge status: Home / Self Care | Payer: BLUE CROSS/BLUE SHIELD | Source: Ambulatory Visit | Attending: Urology | Admitting: Urology

## 2019-11-04 ENCOUNTER — Encounter: Payer: Self-pay | Admitting: Urology

## 2019-11-04 ENCOUNTER — Ambulatory Visit (INDEPENDENT_AMBULATORY_CARE_PROVIDER_SITE_OTHER): Payer: BLUE CROSS/BLUE SHIELD | Admitting: Urology

## 2019-11-04 ENCOUNTER — Other Ambulatory Visit: Payer: Self-pay

## 2019-11-04 VITALS — BP 154/80 | HR 67 | Ht 65.0 in | Wt 275.3 lb

## 2019-11-04 DIAGNOSIS — N201 Calculus of ureter: Secondary | ICD-10-CM

## 2019-11-04 DIAGNOSIS — R3129 Other microscopic hematuria: Secondary | ICD-10-CM | POA: Diagnosis not present

## 2019-11-04 NOTE — Progress Notes (Signed)
11/04/2019 11:58 AM   Victoria Kane August 29, 1971 [id number]  CC: Postop ESWL  HPI: Victoria Kane is a 49 y.o. female with nephrolithiasis who presents today for follow up.  CT renal stone study 06/17/2019 revealed a 7 mm distal left ureteral stone with associated mild left hydronephrosis.   She subsequently underwent ESWL with Dr. Erlene Quan on 06/18/2019. There were no complications.  Stone appeared to smudge following the procedure.  Follow-up KUB 07/02/2019 revealed persistence of the left distal ureteral stone, appearing slightly smaller than the original stone.  Her stone analysis was 15% CaOx Monohydrate, 13% CaOx Dihydrate, 5% Ca Phos Carbonate and 8% Magnesium Ammonium Phosphate.    KUB 07/21/2019 revealed a distal left ureteral stone is unchanged in position.    RUS 07/30/2019 revealed no acute or focal abnormality identified.  No hydronephrosis.  Her UA today is negative for microhematuria and calcium oxalate crystals.    KUB 11/04/2019 does not demonstrate the distal left ureteral stone.    She states that during the last week of December she had very intense left lower quadrant pain associated with gross hematuria.  She states she increased her fluid intake over the next several hours and urinated frequently.  She states the pain abated suddenly.  She did not visualize any passage of a fragment, but she states she has been symptom-free since that incident.  Patient denies any modifying or aggravating factors.  Patient denies any gross hematuria, dysuria or suprapubic/flank pain.  Patient denies any fevers, chills, nausea or vomiting.   UA today yellow cloudy, specific gravity 1.025, +1 blood, pH 5.5, 0-5 WBCs per high-power field, 3-10 RBCs per high-power field, 0-10 epithelial cells per high-power field and moderate bacteria.  PMH: Past Medical History:  Diagnosis Date  . DVT (deep venous thrombosis) [facility])     Surgical History: Past Surgical History:  Procedure  Laterality Date  . CESAREAN SECTION    . EXTRACORPOREAL SHOCK WAVE LITHOTRIPSY Left 06/18/2019   Procedure: EXTRACORPOREAL SHOCK WAVE LITHOTRIPSY (ESWL);  Surgeon: Hollice Espy, MD;  Location: [facility] ORS;  Service: Urology;  Laterality: Left;    Home Medications:  Allergies as of 11/04/2019      Reactions   Bee Pollen    Other    Poison ivy   Tylenol [acetaminophen] Other (See Comments)   Patient states, "They found Bilirubin in my system."      Medication List       Accurate as of November 04, 2019 11:58 AM. If you have any questions, ask your nurse or doctor.        atorvastatin 20 MG tablet Commonly known as: LIPITOR Take 20 mg by mouth at bedtime.   tamsulosin 0.4 MG Caps capsule Commonly known as: FLOMAX Take 0.4 mg by mouth daily.       Allergies:  Allergies  Allergen Reactions  . Bee Pollen   . Other     Poison ivy  . Tylenol [Acetaminophen] Other (See Comments)    Patient states, "They found Bilirubin in my system."     Family History: History reviewed. No pertinent family history.  Social History:  reports that she has been smoking cigarettes. She has been smoking about 1.50 packs per day. She has never used smokeless tobacco. She reports current alcohol use of about 21.0 standard drinks of alcohol per week. She reports that she does not use drugs.  ROS: UROLOGY Frequent Urination?: No Hard to postpone urination?: No Burning/pain with urination?: No Get up at night to urinate?:  No Leakage of urine?: No Urine stream starts and stops?: No Trouble starting stream?: No Do you have to strain to urinate?: No Blood in urine?: No Urinary tract infection?: No Sexually transmitted disease?: No Injury to kidneys or bladder?: No Painful intercourse?: No Weak stream?: No Currently pregnant?: No Vaginal bleeding?: No Last menstrual period?: n  Gastrointestinal Nausea?: No Vomiting?: No Indigestion/heartburn?: No Diarrhea?: No Constipation?: No   Constitutional Fever: No Night sweats?: No Weight loss?: No Fatigue?: No  Skin Skin rash/lesions?: No Itching?: No  Eyes Blurred vision?: No Double vision?: No  Ears/Nose/Throat Sore throat?: No Sinus problems?: No  Hematologic/Lymphatic Swollen glands?: No Easy bruising?: No  Cardiovascular Leg swelling?: No Chest pain?: No  Respiratory Cough?: No Shortness of breath?: No  Endocrine Excessive thirst?: No  Musculoskeletal Back pain?: No Joint pain?: No  Neurological Headaches?: No Dizziness?: No  Psychologic Depression?: No Anxiety?: No  Physical Exam: BP (!) 154/80   Pulse 67   Ht 5\' 5"  (1.651 m)   Wt 275 lb 4.8 oz (124.9 kg)   BMI 45.81 kg/m   Constitutional:  Well nourished. Alert and oriented, No acute distress. HEENT: [place] AT, mask in place.  Trachea midline, no masses. Cardiovascular: No clubbing, cyanosis, or edema. Respiratory: Normal respiratory effort, no increased work of breathing. Neurologic: Grossly intact, no focal deficits, moving all 4 extremities. Psychiatric: Normal mood and affect.  Pertinent Imaging: CLINICAL DATA:  Left ureteral stone.  EXAM: ABDOMEN - 1 VIEW  COMPARISON:  July 21, 2019.  FINDINGS: The bowel gas pattern is normal. No radio-opaque calculi or other significant radiographic abnormality are seen. Distal left ureteral calculus noted on prior exam is not well visualized currently.  IMPRESSION: Negative.   Electronically Signed   By: Marijo Conception M.D.   On: 11/04/2019 12:01 I have independently reviewed the films and it appears there has been interval passage of the left distal ureteral stone.    Assessment & Plan:    1. Left ureteral stone/microscopic hematuria  Likely passed UA still will microscopic hematuria, but with moderate bacteria - will send for culture - follow up based on culture results If culture is positive, will treat with appropriate antibiotics and recheck UA If culture  is negative, will pursue hematuria work-up as she is a heavy smoker   Return for pending urine culture .  Zara Council, PA-C  [facility] [address], [place], [place] [address] [phone]

## 2019-11-05 LAB — URINALYSIS, COMPLETE
Bilirubin, UA: NEGATIVE
Glucose, UA: NEGATIVE
Ketones, UA: NEGATIVE
Leukocytes,UA: NEGATIVE
Nitrite, UA: NEGATIVE
Protein,UA: NEGATIVE
Specific Gravity, UA: 1.025 (ref 1.005–1.030)
Urobilinogen, Ur: 0.2 mg/dL (ref 0.2–1.0)
pH, UA: 5.5 (ref 5.0–7.5)

## 2019-11-05 LAB — MICROSCOPIC EXAMINATION

## 2019-11-06 ENCOUNTER — Telehealth: Payer: Self-pay | Admitting: Family Medicine

## 2019-11-06 NOTE — Telephone Encounter (Signed)
Patient notified and Cysto is scheduled.

## 2019-11-06 NOTE — Telephone Encounter (Signed)
-----   Message from Nori Riis, PA-C sent at 11/06/2019  8:00 AM EST ----- Please let Mrs. Cease know that her urine culture was negative.  She should have a cystoscopy to look inside of her bladder to make sure she does not have a bladder cancer as she is a smoker and smoking is a risk factor for bladder cancer.

## 2019-11-07 LAB — CULTURE, URINE COMPREHENSIVE

## 2019-12-02 ENCOUNTER — Ambulatory Visit (INDEPENDENT_AMBULATORY_CARE_PROVIDER_SITE_OTHER): Payer: BLUE CROSS/BLUE SHIELD | Admitting: Urology

## 2019-12-02 ENCOUNTER — Other Ambulatory Visit: Payer: Self-pay

## 2019-12-02 ENCOUNTER — Encounter: Payer: Self-pay | Admitting: Urology

## 2019-12-02 VITALS — BP 138/80 | HR 77 | Ht 64.0 in | Wt 277.0 lb

## 2019-12-02 DIAGNOSIS — R3129 Other microscopic hematuria: Secondary | ICD-10-CM

## 2019-12-02 LAB — URINALYSIS, COMPLETE
Bilirubin, UA: NEGATIVE
Glucose, UA: NEGATIVE
Ketones, UA: NEGATIVE
Leukocytes,UA: NEGATIVE
Nitrite, UA: NEGATIVE
Specific Gravity, UA: 1.025 (ref 1.005–1.030)
Urobilinogen, Ur: 1 mg/dL (ref 0.2–1.0)
pH, UA: 7 (ref 5.0–7.5)

## 2019-12-02 LAB — MICROSCOPIC EXAMINATION: Bacteria, UA: NONE SEEN

## 2019-12-02 NOTE — Progress Notes (Signed)
   12/02/19  CC:  Chief Complaint  Patient presents with  . Cysto   Indications: Microhematuria  HPI: Refer to Shannon's note 11/04/2019.  Denies gross hematuria.  Urinalysis today negative RBCs on microscopy  Blood pressure 138/80, pulse 77, height 5\' 4"  (1.626 m), weight 277 lb (125.6 kg). NED. A&Ox3.   Normal external genitalia with patent urethral meatus  Cystoscopy Procedure Note  Patient identification was confirmed, informed consent was obtained, and patient was prepped using Betadine solution.  Lidocaine jelly was administered per urethral meatus.    Procedure: - Flexible cystoscope introduced, without any difficulty.   - Thorough search of the bladder revealed:    normal urethral meatus    normal urothelium    no stones    no ulcers     no tumors    no urethral polyps    no trabeculation  - Ureteral orifices were normal in position and appearance.  Post-Procedure: - Patient tolerated the procedure well  Assessment/ Plan: -No mucosal abnormalities on cystoscopy -Follow-up Shannon in 6 months with KUB   Abbie Sons, MD

## 2020-04-13 ENCOUNTER — Other Ambulatory Visit
Admission: RE | Admit: 2020-04-13 | Discharge: 2020-04-13 | Disposition: A | Discharge status: Home / Self Care | Payer: Medicaid Other | Source: Ambulatory Visit | Attending: Otolaryngology | Admitting: Otolaryngology

## 2020-04-13 ENCOUNTER — Other Ambulatory Visit: Payer: Self-pay

## 2020-04-13 HISTORY — DX: Personal history of urinary calculi: Z87.442

## 2020-04-13 HISTORY — DX: Unspecified osteoarthritis, unspecified site: M19.90

## 2020-04-13 NOTE — Patient Instructions (Signed)
Your procedure is scheduled on: Tuesday 04/19/20  Report to [facility]. To find out your arrival time please call [phone] between 1PM - 3PM on Monday 04/18/20   Remember: Instructions that are not followed completely may result in serious medical risk, up to and including death, or upon the discretion of your surgeon and anesthesiologist your surgery may need to be rescheduled.     __X__ 1. Do not eat food after midnight the night before your procedure.                 No gum chewing or hard candies. You may drink clear liquids up to 2 hours                 before you are scheduled to arrive for your surgery- DO NOT drink clear                 liquids within 2 hours of the start of your surgery.                 Clear Liquids include:  water, apple juice without pulp, clear carbohydrate                 drink such as Clearfast or Gatorade, Black Coffee or Tea (Do not add                 milk or creamer to coffee or tea).    __X__2.  On the morning of surgery brush your teeth with toothpaste and water, you may rinse your mouth with mouthwash if you wish.  Do not swallow any toothpaste or mouthwash.      __X__ 3.  No Alcohol for 24 hours before or after surgery.   __X__ 4.  Do Not Smoke or use e-cigarettes For 24 Hours Prior to Your Surgery.                 Do not use any chewable tobacco products for at least 6 hours prior to                 surgery.  __X__5.  Notify your doctor if there is any change in your medical condition      (cold, fever, infections).      Do NOT wear jewelry, make-up, hairpins, clips or nail polish. Do NOT wear lotions, powders, or perfumes.  Do NOT shave 48 hours prior to surgery. Men may shave face and neck. Do NOT bring valuables to the hospital.     [facility] is not responsible for any belongings or valuables.   Contacts, dentures/partials or body piercings may not be worn into surgery.  Bring a case for your contacts, glasses or hearing aids, a denture cup will be supplied.   Leave your suitcase in the car. After surgery it may be brought to your room.   For patients admitted to the hospital, discharge time is determined by your treatment team.     __X__ Take these medicines the morning of surgery with A SIP OF WATER:     1. None     __X__ Use CHG Soap as directed   __X__ Use inhalers on the day of surgery. Also bring the inhaler with you to the hospital on the morning of surgery.   __X__ Stop Anti-inflammatories 7 days before surgery such as Advil, Ibuprofen, Motrin, BC or Goodies Powder, Naprosyn, Naproxen, Aleve, Aspirin, Meloxicam. May take Tylenol if  needed for pain or discomfort.    __X__Do not start taking any new herbal supplements or vitamins prior to your procedure.    Wear comfortable clothing (specific to your surgery type) to the hospital.  Plan for stool softeners for home use; pain medications have a tendency to cause constipation. You can also help prevent constipation by eating foods high in fiber such as fruits and vegetables and drinking plenty of fluids as your diet allows.  After surgery, you can prevent lung complications by doing breathing exercises.Take deep breaths and cough every 1-2 hours. Your doctor may order a device called an Incentive Spirometer to help you take deep breaths.  Please call the [facility] Department at [phone] if you have any questions about these instructions

## 2020-04-15 ENCOUNTER — Other Ambulatory Visit
Admission: RE | Admit: 2020-04-15 | Discharge: 2020-04-15 | Disposition: A | Discharge status: Home / Self Care | Payer: BLUE CROSS/BLUE SHIELD | Source: Ambulatory Visit | Attending: Otolaryngology | Admitting: Otolaryngology

## 2020-04-15 ENCOUNTER — Other Ambulatory Visit: Payer: Self-pay

## 2020-04-15 DIAGNOSIS — Z01812 Encounter for preprocedural laboratory examination: Secondary | ICD-10-CM | POA: Diagnosis not present

## 2020-04-15 DIAGNOSIS — Z20822 Contact with and (suspected) exposure to covid-19: Secondary | ICD-10-CM | POA: Insufficient documentation

## 2020-04-15 LAB — SARS CORONAVIRUS 2 (TAT 6-24 HRS): SARS Coronavirus 2: NEGATIVE

## 2020-04-19 ENCOUNTER — Observation Stay
Admission: RE | Admit: 2020-04-19 | Discharge: 2020-04-20 | Disposition: A | Discharge status: Home / Self Care | Payer: BLUE CROSS/BLUE SHIELD | Attending: Otolaryngology | Admitting: Otolaryngology

## 2020-04-19 ENCOUNTER — Ambulatory Visit: Payer: BLUE CROSS/BLUE SHIELD | Admitting: Certified Registered Nurse Anesthetist

## 2020-04-19 ENCOUNTER — Encounter
Admission: RE | Disposition: A | Discharge status: Home / Self Care | Payer: Self-pay | Source: Home / Self Care | Attending: Otolaryngology

## 2020-04-19 ENCOUNTER — Other Ambulatory Visit: Payer: Self-pay

## 2020-04-19 ENCOUNTER — Encounter: Payer: Self-pay | Admitting: Otolaryngology

## 2020-04-19 DIAGNOSIS — D110 Benign neoplasm of parotid gland: Principal | ICD-10-CM | POA: Insufficient documentation

## 2020-04-19 DIAGNOSIS — Z9889 Other specified postprocedural states: Secondary | ICD-10-CM

## 2020-04-19 HISTORY — PX: PAROTIDECTOMY: SHX2163

## 2020-04-19 LAB — POCT PREGNANCY, URINE: Preg Test, Ur: NEGATIVE

## 2020-04-19 SURGERY — EXCISION, PAROTID GLAND
Anesthesia: General | Laterality: Left

## 2020-04-19 MED ORDER — CHLORHEXIDINE GLUCONATE 0.12 % MT SOLN
OROMUCOSAL | Status: AC
  Administered 2020-04-19: 15 mL via OROMUCOSAL
  Filled 2020-04-19: qty 15

## 2020-04-19 MED ORDER — EPHEDRINE SULFATE 50 MG/ML IJ SOLN
INTRAMUSCULAR | Status: DC | PRN
  Administered 2020-04-19: 7.5 mg via INTRAVENOUS

## 2020-04-19 MED ORDER — SUCCINYLCHOLINE CHLORIDE 200 MG/10ML IV SOSY
PREFILLED_SYRINGE | INTRAVENOUS | Status: AC
  Filled 2020-04-19: qty 10

## 2020-04-19 MED ORDER — FENTANYL CITRATE (PF) 100 MCG/2ML IJ SOLN
INTRAMUSCULAR | Status: AC
  Filled 2020-04-19: qty 2

## 2020-04-19 MED ORDER — FENTANYL CITRATE (PF) 100 MCG/2ML IJ SOLN
INTRAMUSCULAR | Status: DC | PRN
  Administered 2020-04-19: 50 ug via INTRAVENOUS
  Administered 2020-04-19: 100 ug via INTRAVENOUS

## 2020-04-19 MED ORDER — FAMOTIDINE 20 MG PO TABS
ORAL_TABLET | ORAL | Status: AC
  Administered 2020-04-19: 20 mg via ORAL
  Filled 2020-04-19: qty 1

## 2020-04-19 MED ORDER — PROPOFOL 10 MG/ML IV BOLUS
INTRAVENOUS | Status: DC | PRN
  Administered 2020-04-19: 200 mg via INTRAVENOUS

## 2020-04-19 MED ORDER — FAMOTIDINE 20 MG PO TABS: 20.0000 mg | ORAL_TABLET | Freq: Once | ORAL | Status: AC

## 2020-04-19 MED ORDER — LIDOCAINE-EPINEPHRINE 1 %-1:100000 IJ SOLN
INTRAMUSCULAR | Status: AC
  Filled 2020-04-19: qty 1

## 2020-04-19 MED ORDER — ONDANSETRON HCL 4 MG PO TABS: 4.0000 mg | ORAL_TABLET | ORAL | Status: DC | PRN

## 2020-04-19 MED ORDER — DEXAMETHASONE SODIUM PHOSPHATE 10 MG/ML IJ SOLN
INTRAMUSCULAR | Status: DC | PRN
  Administered 2020-04-19: 10 mg via INTRAVENOUS

## 2020-04-19 MED ORDER — PROPOFOL 10 MG/ML IV BOLUS
INTRAVENOUS | Status: AC
  Filled 2020-04-19: qty 20

## 2020-04-19 MED ORDER — LIDOCAINE HCL (CARDIAC) PF 100 MG/5ML IV SOSY
PREFILLED_SYRINGE | INTRAVENOUS | Status: DC | PRN
  Administered 2020-04-19: 100 mg via INTRAVENOUS

## 2020-04-19 MED ORDER — LACTATED RINGERS IV SOLN: INTRAVENOUS | Status: DC

## 2020-04-19 MED ORDER — ZOLPIDEM TARTRATE 5 MG PO TABS: 5.0000 mg | ORAL_TABLET | Freq: Every evening | ORAL | Status: DC | PRN

## 2020-04-19 MED ORDER — CHLORHEXIDINE GLUCONATE 0.12 % MT SOLN: 15.0000 mL | Freq: Once | OROMUCOSAL | Status: AC

## 2020-04-19 MED ORDER — MORPHINE SULFATE (PF) 2 MG/ML IV SOLN
2.0000 mg | INTRAVENOUS | Status: DC | PRN
  Administered 2020-04-19: 2 mg via INTRAVENOUS
  Administered 2020-04-19: 1 mg via INTRAVENOUS
  Filled 2020-04-19 (×2): qty 1

## 2020-04-19 MED ORDER — OXYMETAZOLINE HCL 0.05 % NA SOLN
1.0000 | Freq: Two times a day (BID) | NASAL | Status: DC | PRN
  Filled 2020-04-19: qty 15

## 2020-04-19 MED ORDER — OXYCODONE HCL 5 MG/5ML PO SOLN: 5.0000 mg | Freq: Once | ORAL | Status: DC | PRN

## 2020-04-19 MED ORDER — ONDANSETRON HCL 4 MG/2ML IJ SOLN: 4.0000 mg | INTRAMUSCULAR | Status: DC | PRN

## 2020-04-19 MED ORDER — DEXTROSE-NACL 5-0.45 % IV SOLN: INTRAVENOUS | Status: DC

## 2020-04-19 MED ORDER — OXYMETAZOLINE HCL 0.05 % NA SOLN
NASAL | Status: AC
  Filled 2020-04-19: qty 30

## 2020-04-19 MED ORDER — EPHEDRINE 5 MG/ML INJ
INTRAVENOUS | Status: AC
  Filled 2020-04-19: qty 10

## 2020-04-19 MED ORDER — FENTANYL CITRATE (PF) 100 MCG/2ML IJ SOLN
25.0000 ug | INTRAMUSCULAR | Status: DC | PRN
  Administered 2020-04-19 (×3): 25 ug via INTRAVENOUS

## 2020-04-19 MED ORDER — ONDANSETRON HCL 4 MG/2ML IJ SOLN
INTRAMUSCULAR | Status: DC | PRN
  Administered 2020-04-19: 4 mg via INTRAVENOUS

## 2020-04-19 MED ORDER — FENTANYL CITRATE (PF) 100 MCG/2ML IJ SOLN
INTRAMUSCULAR | Status: AC
  Administered 2020-04-19: 25 ug via INTRAVENOUS
  Filled 2020-04-19: qty 2

## 2020-04-19 MED ORDER — ORAL CARE MOUTH RINSE: 15.0000 mL | Freq: Once | OROMUCOSAL | Status: AC

## 2020-04-19 MED ORDER — MIDAZOLAM HCL 2 MG/2ML IJ SOLN
INTRAMUSCULAR | Status: AC
  Filled 2020-04-19: qty 2

## 2020-04-19 MED ORDER — SUCCINYLCHOLINE CHLORIDE 20 MG/ML IJ SOLN
INTRAMUSCULAR | Status: DC | PRN
  Administered 2020-04-19: 200 mg via INTRAVENOUS

## 2020-04-19 MED ORDER — OXYCODONE HCL 5 MG PO TABS: 5.0000 mg | ORAL_TABLET | Freq: Once | ORAL | Status: DC | PRN

## 2020-04-19 MED ORDER — BACITRACIN ZINC 500 UNIT/GM EX OINT
TOPICAL_OINTMENT | CUTANEOUS | Status: AC
  Filled 2020-04-19: qty 28.35

## 2020-04-19 MED ORDER — DEXAMETHASONE SODIUM PHOSPHATE 10 MG/ML IJ SOLN
INTRAMUSCULAR | Status: AC
  Filled 2020-04-19: qty 1

## 2020-04-19 MED ORDER — MIDAZOLAM HCL 2 MG/2ML IJ SOLN
INTRAMUSCULAR | Status: DC | PRN
  Administered 2020-04-19: 2 mg via INTRAVENOUS

## 2020-04-19 MED ORDER — OXYCODONE HCL 5 MG/5ML PO SOLN
5.0000 mg | ORAL | Status: DC | PRN
  Administered 2020-04-19 – 2020-04-20 (×3): 5 mg via ORAL
  Filled 2020-04-19 (×5): qty 5

## 2020-04-19 MED ORDER — ATORVASTATIN CALCIUM 20 MG PO TABS
20.0000 mg | ORAL_TABLET | Freq: Every day | ORAL | Status: DC
  Administered 2020-04-19: 20 mg via ORAL
  Filled 2020-04-19 (×2): qty 1

## 2020-04-19 MED ORDER — LIDOCAINE-EPINEPHRINE 1 %-1:100000 IJ SOLN
INTRAMUSCULAR | Status: DC | PRN
  Administered 2020-04-19: 8 mL

## 2020-04-19 MED ORDER — BACITRACIN ZINC 500 UNIT/GM EX OINT
1.0000 "application " | TOPICAL_OINTMENT | Freq: Three times a day (TID) | CUTANEOUS | Status: DC
  Administered 2020-04-20: 1 via TOPICAL
  Filled 2020-04-19 (×3): qty 0.9

## 2020-04-19 MED ORDER — HYDROMORPHONE HCL 1 MG/ML IJ SOLN
INTRAMUSCULAR | Status: AC
  Filled 2020-04-19: qty 1

## 2020-04-19 MED ORDER — ONDANSETRON HCL 4 MG/2ML IJ SOLN
INTRAMUSCULAR | Status: AC
  Filled 2020-04-19: qty 2

## 2020-04-19 MED ORDER — HYDROMORPHONE HCL 1 MG/ML IJ SOLN
INTRAMUSCULAR | Status: DC | PRN
  Administered 2020-04-19 (×2): .5 mg via INTRAVENOUS

## 2020-04-19 SURGICAL SUPPLY — 42 items
ADHESIVE MASTISOL STRL (MISCELLANEOUS) IMPLANT
BLADE SURG 15 STRL LF DISP TIS (BLADE) ×1 IMPLANT
BLADE SURG 15 STRL SS (BLADE) ×2
CANISTER SUCT 1200ML W/VALVE (MISCELLANEOUS) ×3 IMPLANT
CLOSURE WOUND 1/4X4 (GAUZE/BANDAGES/DRESSINGS) ×1
CORD BIP STRL DISP 12FT (MISCELLANEOUS) ×3 IMPLANT
COTTON BALL STRL MEDIUM (GAUZE/BANDAGES/DRESSINGS) ×3 IMPLANT
COVER WAND RF STERILE (DRAPES) ×3 IMPLANT
DERMABOND ADVANCED (GAUZE/BANDAGES/DRESSINGS) ×2
DERMABOND ADVANCED .7 DNX12 (GAUZE/BANDAGES/DRESSINGS) ×1 IMPLANT
DRAIN TLS ROUND 10FR (DRAIN) IMPLANT
DRAPE MAG INST 16X20 L/F (DRAPES) ×3 IMPLANT
DRAPE SURG 17X11 SM STRL (DRAPES) ×3 IMPLANT
DRSG TEGADERM 2-3/8X2-3/4 SM (GAUZE/BANDAGES/DRESSINGS) ×9 IMPLANT
ELECT NEEDLE 20X.3 GREEN (MISCELLANEOUS) ×3
ELECT REM PT RETURN 9FT ADLT (ELECTROSURGICAL) ×3
ELECTRODE NEEDLE 20X.3 GREEN (MISCELLANEOUS) ×1 IMPLANT
ELECTRODE REM PT RTRN 9FT ADLT (ELECTROSURGICAL) ×1 IMPLANT
FORCEPS JEWEL BIP 4-3/4 STR (INSTRUMENTS) ×3 IMPLANT
GAUZE 4X4 16PLY RFD (DISPOSABLE) ×3 IMPLANT
GLOVE BIO SURGEON STRL SZ7.5 (GLOVE) ×6 IMPLANT
GOWN STRL REUS W/ TWL LRG LVL3 (GOWN DISPOSABLE) ×3 IMPLANT
GOWN STRL REUS W/TWL LRG LVL3 (GOWN DISPOSABLE) ×6
HOOK STAY BLUNT/RETRACTOR 5M (MISCELLANEOUS) ×3 IMPLANT
JACKSON PRATT 7MM (INSTRUMENTS) IMPLANT
KIT TURNOVER KIT A (KITS) ×3 IMPLANT
LABEL OR SOLS (LABEL) ×3 IMPLANT
MARKER SKIN DUAL TIP RULER LAB (MISCELLANEOUS) ×3 IMPLANT
NS IRRIG 500ML POUR BTL (IV SOLUTION) ×3 IMPLANT
PACK HEAD/NECK (MISCELLANEOUS) ×3 IMPLANT
PROBE MONO 100X0.75 ELECT 1.9M (MISCELLANEOUS) ×3 IMPLANT
SHEARS HARMONIC 9CM CVD (BLADE) ×3 IMPLANT
SPONGE KITTNER 5P (MISCELLANEOUS) ×18 IMPLANT
STRIP CLOSURE SKIN 1/4X4 (GAUZE/BANDAGES/DRESSINGS) ×2 IMPLANT
SUT PROLENE 5 0 PS 3 (SUTURE) ×3 IMPLANT
SUT SILK 0 (SUTURE) ×2
SUT SILK 0 30XBRD TIE 6 (SUTURE) ×1 IMPLANT
SUT SILK 2 0 (SUTURE) ×2
SUT SILK 2-0 30XBRD TIE 12 (SUTURE) ×1 IMPLANT
SUT VIC AB 4-0 P2 18 (SUTURE) ×3 IMPLANT
SUT VIC AB 4-0 RB1 18 (SUTURE) ×3 IMPLANT
SYSTEM CHEST DRAIN TLS 7FR (DRAIN) ×3 IMPLANT

## 2020-04-19 NOTE — Progress Notes (Signed)
Pt arrived to the unit at 1156, Bedside report given form PACU nurse. Pt is AxOx4 with daughter at bedisde. Pt had Superficial Left Parotidectomy with Facial Nerve Dissection, TLS drain in place. Drain should be changed every shift.ICE pack applied to surgical site, as long as patient could tolerate.

## 2020-04-19 NOTE — H&P (Signed)
..  History and Physical paper copy reviewed and updated date of procedure and will be scanned into system.  Patient seen and examined.  Left side marked.

## 2020-04-19 NOTE — Progress Notes (Signed)
..   04/19/2020 4:54 PM  Victoria Kane [id number]  Post-Op Day 0    Temp:  [97.6 F (36.4 C)-98 F (36.7 C)] 98 F (36.7 C) (07/20 1347) Pulse Rate:  [54-83] 61 (07/20 1347) Resp:  [14-21] 20 (07/20 1347) BP: (115-140)/(57-75) 124/66 (07/20 1347) SpO2:  [92 %-99 %] 97 % (07/20 1347) Weight:  [124.1 kg] 124.1 kg (07/20 7939),     Intake/Output Summary (Last 24 hours) at 04/19/2020 1654 Last data filed at 04/19/2020 1000 Gross per 24 hour  Intake 700 ml  Output 25 ml  Net 675 ml    Results for orders placed or performed during the hospital encounter of 04/19/20 (from the past 24 hour(s))  Pregnancy, urine POC     Status: None   Collection Time: 04/19/20  6:16 AM  Result Value Ref Range   Preg Test, Ur NEGATIVE NEGATIVE    SUBJECTIVE:  No acute events.  Had some blood come from nasopharynx when she got up to use the bathroom.  Pain controlled.  Ambulating.  Tolerating diet  OBJECTIVE:  GEN-  NAD, supine in bed NECK-  Incision c/d/i with expected post-operative edema.  Mild fullness at tumor resection site NEURO- CN VII intact and symmetrical  IMPRESSION:  S/p Left superficial parotidectomy  PLAN:  Advance diet to soft mechanical.  Saline lock IV.  Afrin if nose bleed occurs again, most likely from nasal trumpet placed during surgery as patient did have nose bleed at that time as well.  Anticipate removal of drain tomorrow and discharge home after breakfast.  [name] 04/19/2020, 4:54 PM

## 2020-04-19 NOTE — Op Note (Signed)
..  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  Left facial nerve identified and preserved and stimulated robustly at conclusion of procedure.  Procedure:  After the patient was identified in hold and the history and physical and consent was reviewed and updated. The patient was marked in the normal fashion in an existing skin crease of her left neck  And pre-auricular region. The patient was next taken to the operating room and placed in a supine position. General endotracheal anesthesia was induced in the normal fashion. The patient's left neck was neck injected with 8cc's of 1% lidocaine with 1:100,000 Epinephrine. The patient was next prepped and draped in a sterile normal fashion.  At this time, a 15 blade scalpel was used to make a modified blair skin incision along the previously marked neck crease in the left neck after the proper time out was performed. Dissection was carefully performed through the subcutaneous tissues with combination of Bovie electrocautery and blunt dissection. The SMAS was identified and this was dissected from the overlying subcutaneous skin overlying the parotid fascia.  The cartilagenous external auditory canal was seperated from the posterior aspect of the parotid gland.  This was carefully dissected until the temporo-mandibular suture line was encountered.  Careful dissection just inferior and deep revealed the main branch of the facial nerve.  This was carefully stimulated with good movement of all branches.  Dissection of the nerve revealed the pes and the branching of the inferior and superior branches.  The inferior branch was dissected from the inferior mass and  care was taken to avoid injury to the facial nerve or the mass.  The mass was dissected inferiorly and next, the superior branch was dissected superior to the mass and this was carefully dissected until the mass was free of the facial nerve.  The remaining attachments were divided and the mass was sent for permanent pathological specimen.  The facial nerve was stimulated and all branches moved robustly.  At this time, the wound was copiously irrigated with sterile saline.  A posterior TLS drain was placed. Meticulous hemostasis with bipolar was obtained. Surgicel was placed in the wound bed. The wound was then closed in a multilayered fashion with vicryl for subcutaneous tissues and 6.0 Prolene in a running fashion the cutaneous skin and topped with bacitracin.   At this time the patient was extubated and taken to PACU in good condition.    Dispo: PACU in good condition.  Plan:  Ice, elevation, analgesia.  [name] 04/19/2020 10:01 AM

## 2020-04-19 NOTE — Anesthesia Procedure Notes (Signed)
Procedure Name: Intubation Date/Time: 04/19/2020 7:35 AM Performed by: Natasha Mead, CRNA Pre-anesthesia Checklist: Patient identified, Emergency Drugs available, Suction available, Patient being monitored and Timeout performed Patient Re-evaluated:Patient Re-evaluated prior to induction Oxygen Delivery Method: Circle system utilized Preoxygenation: Pre-oxygenation with 100% oxygen Induction Type: IV induction Ventilation: Mask ventilation without difficulty Laryngoscope Size: McGraph and 3 Grade View: Grade I Tube type: Oral Tube size: 7.0 mm Number of attempts: 1 Airway Equipment and Method: Stylet Placement Confirmation: ETT inserted through vocal cords under direct vision,  positive ETCO2 and breath sounds checked- equal and bilateral (distant bilat) Secured at: 19 cm Tube secured with: Tape Dental Injury: Teeth and Oropharynx as per pre-operative assessment

## 2020-04-19 NOTE — Anesthesia Preprocedure Evaluation (Signed)
Anesthesia Evaluation  Patient identified by MRN, date of birth, ID band Patient awake    Reviewed: Allergy & Precautions, H&P , NPO status , Patient's Chart, lab work & pertinent test results  History of Anesthesia Complications Negative for: history of anesthetic complications  Airway Mallampati: III  TM Distance: <3 FB Neck ROM: full    Dental  (+) Chipped, Poor Dentition   Pulmonary neg shortness of breath, Current Smoker and Patient abstained from smoking.,    Pulmonary exam normal        Cardiovascular Exercise Tolerance: Good (-) angina(-) Past MI and (-) DOE negative cardio ROS Normal cardiovascular exam     Neuro/Psych negative neurological ROS  negative psych ROS   GI/Hepatic negative GI ROS, Neg liver ROS, neg GERD  ,  Endo/Other  negative endocrine ROS  Renal/GU      Musculoskeletal   Abdominal   Peds  Hematology negative hematology ROS (+)   Anesthesia Other Findings Past Medical History: No date: Arthritis No date: DVT (deep venous thrombosis) (HCC) No date: History of kidney stones  Past Surgical History: No date: CESAREAN SECTION 06/18/2019: EXTRACORPOREAL SHOCK WAVE LITHOTRIPSY; Left     Comment:  Procedure: EXTRACORPOREAL SHOCK WAVE LITHOTRIPSY (ESWL);              Surgeon: Hollice Espy, MD;  Location: [facility] ORS;                Service: Urology;  Laterality: Left;  BMI    Body Mass Index: 45.55 kg/m      Reproductive/Obstetrics negative OB ROS                             Anesthesia Physical Anesthesia Plan  ASA: III  Anesthesia Plan: General ETT   Post-op Pain Management:    Induction: Intravenous  PONV Risk Score and Plan: Ondansetron, Dexamethasone, Midazolam and Treatment may vary due to age or medical condition  Airway Management Planned: Oral ETT  Additional Equipment:   Intra-op Plan:   Post-operative Plan: Extubation in OR  Informed  Consent: I have reviewed the patients History and Physical, chart, labs and discussed the procedure including the risks, benefits and alternatives for the proposed anesthesia with the patient or authorized representative who has indicated his/her understanding and acceptance.     Dental Advisory Given  Plan Discussed with: Anesthesiologist, CRNA and Surgeon  Anesthesia Plan Comments: (Patient consented for risks of anesthesia including but not limited to:  - adverse reactions to medications - damage to eyes, teeth, lips or other oral mucosa - nerve damage due to positioning  - sore throat or hoarseness - Damage to heart, brain, nerves, lungs, other parts of body or loss of life  Patient voiced understanding.)        Anesthesia Quick Evaluation

## 2020-04-19 NOTE — Transfer of Care (Signed)
Immediate Anesthesia Transfer of Care Note  Patient: Pamala Hayman  Procedure(s) Performed: SUPERFICIAL PAROTIDECTOMY WITH FACIAL NERVE MONITORING (Left )  Patient Location: PACU  Anesthesia Type:General  Level of Consciousness: awake, alert  and oriented  Airway & Oxygen Therapy: Patient Spontanous Breathing  Post-op Assessment: Report given to RN and Post -op Vital signs reviewed and stable  Post vital signs: stable  Last Vitals:  Vitals Value Taken Time  BP 125/59 04/19/20 1008  Temp    Pulse 78 04/19/20 1010  Resp 11 04/19/20 1010  SpO2 95 % 04/19/20 1010  Vitals shown include unvalidated device data.  Last Pain:  Vitals:   04/19/20 0981  PainSc: 0-No pain         Complications: No complications documented.

## 2020-04-19 NOTE — Progress Notes (Addendum)
   04/19/20 0700  Clinical Encounter Type  Visited With Patient  Visit Type Initial  Referral From Chaplain  Consult/Referral To Chaplain  Chaplain briefly visited with patient. Patient asked chaplain if she would pray with her. Patient reached for chaplain's hand. Chaplain prayed, staff came in and chaplain wished patient well and left.

## 2020-04-20 ENCOUNTER — Encounter: Payer: Self-pay | Admitting: Otolaryngology

## 2020-04-20 LAB — SURGICAL PATHOLOGY

## 2020-04-20 MED ORDER — BACITRACIN ZINC 500 UNIT/GM EX OINT: 1.0000 "application " | TOPICAL_OINTMENT | Freq: Three times a day (TID) | CUTANEOUS | 0 refills | Status: AC

## 2020-04-20 MED ORDER — OXYCODONE HCL 5 MG PO TABS: 5.0000 mg | ORAL_TABLET | Freq: Four times a day (QID) | ORAL | 0 refills | Status: AC | PRN

## 2020-04-20 MED ORDER — ONDANSETRON HCL 4 MG PO TABS: 4.0000 mg | ORAL_TABLET | ORAL | 0 refills | Status: AC | PRN

## 2020-04-20 MED ORDER — OXYCODONE HCL 5 MG/5ML PO SOLN: 5.0000 mg | ORAL | 0 refills | Status: DC | PRN

## 2020-04-20 NOTE — Anesthesia Postprocedure Evaluation (Signed)
Anesthesia Post Note  Patient: Victoria Kane  Procedure(s) Performed: SUPERFICIAL PAROTIDECTOMY WITH FACIAL NERVE MONITORING (Left )  Patient location during evaluation: PACU Anesthesia Type: General Level of consciousness: awake and alert Pain management: pain level controlled Vital Signs Assessment: post-procedure vital signs reviewed and stable Respiratory status: spontaneous breathing, nonlabored ventilation, respiratory function stable and patient connected to nasal cannula oxygen Cardiovascular status: blood pressure returned to baseline and stable Postop Assessment: no apparent nausea or vomiting Anesthetic complications: no   No complications documented.   Last Vitals:  Vitals:   04/20/20 0046 04/20/20 0505  BP: 124/72 (!) 118/59  Pulse: 61 (!) 53  Resp: 20 16  Temp: 36.5 C (!) 36.3 C  SpO2: 97% 98%    Last Pain:  Vitals:   04/20/20 0505  TempSrc: Oral  PainSc:                  Precious Haws Rion Catala

## 2020-04-20 NOTE — Plan of Care (Signed)
The patient has been discharged. IV has been removed. Gauze provided for incision site cleaning.  Problem: Education: Goal: Knowledge of General Education information will improve Description: Including pain rating scale, medication(s)/side effects and non-pharmacologic comfort measures Outcome: Completed/Met   Problem: Health Behavior/Discharge Planning: Goal: Ability to manage health-related needs will improve Outcome: Completed/Met   Problem: Clinical Measurements: Goal: Ability to maintain clinical measurements within normal limits will improve Outcome: Completed/Met Goal: Will remain free from infection Outcome: Completed/Met Goal: Diagnostic test results will improve Outcome: Completed/Met Goal: Respiratory complications will improve Outcome: Completed/Met Goal: Cardiovascular complication will be avoided Outcome: Completed/Met   Problem: Activity: Goal: Risk for activity intolerance will decrease Outcome: Completed/Met   Problem: Nutrition: Goal: Adequate nutrition will be maintained Outcome: Completed/Met   Problem: Elimination: Goal: Will not experience complications related to bowel motility Outcome: Completed/Met Goal: Will not experience complications related to urinary retention Outcome: Completed/Met   Problem: Pain Managment: Goal: General experience of comfort will improve Outcome: Completed/Met   Problem: Safety: Goal: Ability to remain free from injury will improve Outcome: Completed/Met

## 2020-04-20 NOTE — Final Progress Note (Signed)
..   04/20/2020 7:14 AM  Victoria Kane [id number]  Post-Op Day 1    Temp:  [97.4 F (36.3 C)-98.3 F (36.8 C)] 97.4 F (36.3 C) (07/21 0505) Pulse Rate:  [53-83] 53 (07/21 0505) Resp:  [14-21] 16 (07/21 0505) BP: (115-125)/(57-72) 118/59 (07/21 0505) SpO2:  [92 %-99 %] 98 % (07/21 0505),     Intake/Output Summary (Last 24 hours) at 04/20/2020 0714 Last data filed at 04/20/2020 0500 Gross per 24 hour  Intake 940 ml  Output 65 ml  Net 875 ml    No results found for this or any previous visit (from the past 24 hour(s)).  SUBJECTIVE:  No acute events.  Slept well.  Ambulating to bathroom.  Tolerating diet.  Pain controlled.  Patient reports she wants to go home.  OBJECTIVE:  GEN-  NAD, supine in bed NEURO-  CN VII intact bilaterally and symmetrical NECK-  Drain with serous drainage and removed.  Incision c/d/i with expected post-operative edema  IMPRESSION:  S/p Left superficial parotidectomy POD#1 doing well  PLAN:  Discussed swelling with patient and drain removed.  Recommend follow up in 1 week.  Ice to left neck intermittently to help with swelling.    [name] 04/20/2020, 7:14 AM

## 2020-06-03 ENCOUNTER — Ambulatory Visit: Payer: BLUE CROSS/BLUE SHIELD | Admitting: Urology

## 2020-06-08 ENCOUNTER — Encounter: Payer: Self-pay | Admitting: Urology

## 2020-06-08 ENCOUNTER — Ambulatory Visit: Payer: BLUE CROSS/BLUE SHIELD | Admitting: Urology

## 2020-08-03 ENCOUNTER — Other Ambulatory Visit: Payer: Self-pay | Admitting: Family Medicine

## 2020-08-03 DIAGNOSIS — Z1231 Encounter for screening mammogram for malignant neoplasm of breast: Secondary | ICD-10-CM

## 2020-08-04 ENCOUNTER — Inpatient Hospital Stay: Admission: RE | Admit: 2020-08-04 | Payer: Medicaid Other | Source: Ambulatory Visit

## 2021-01-26 IMAGING — CR DG ABDOMEN 1V
2 series · 3 of 3 positions shown · non-contrast
Comparison: 07/02/2019.  06/18/2019.  CT 06/17/2019.

CLINICAL DATA: Previous left-sided stone.  ESWL 1 month ago.

EXAM:
ABDOMEN - 1 VIEW

[abdomen kub (1 of 2)]
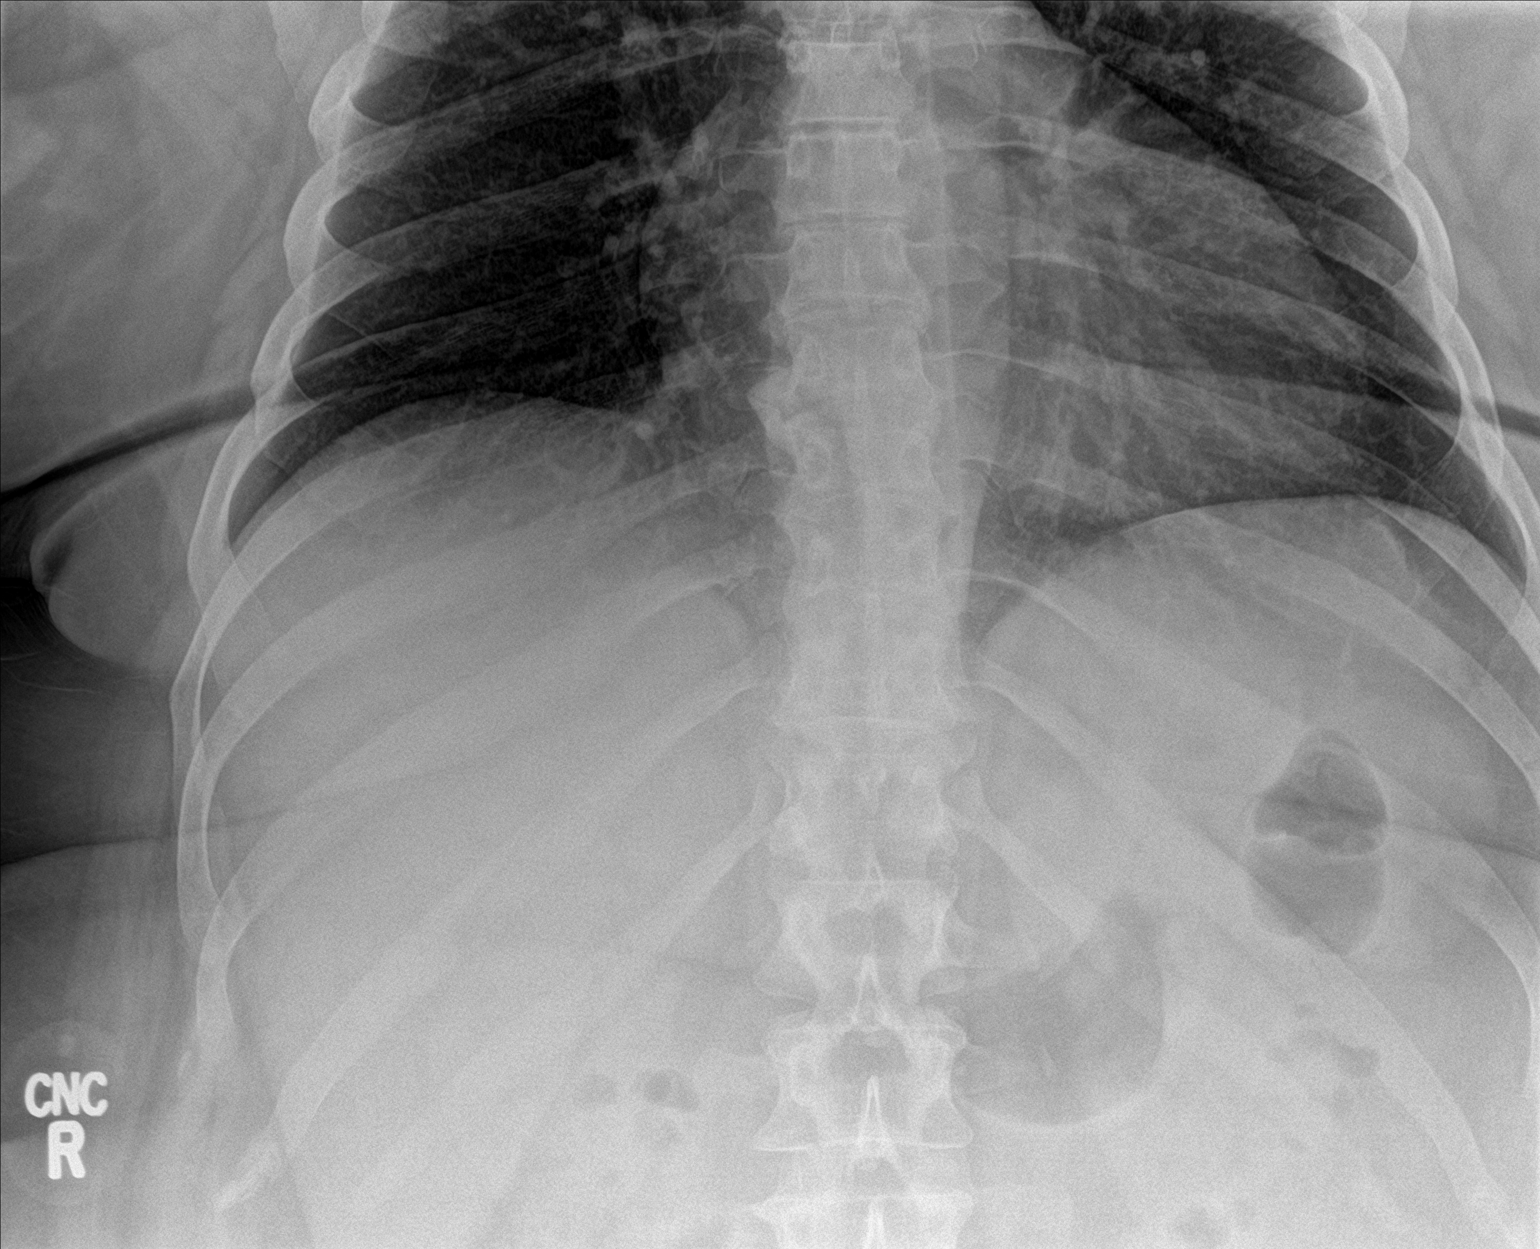

[Series 2: abdomen kub · 0.14mm/px · 2 of 2 slices shown (2 of 2)]
[im 1/2]
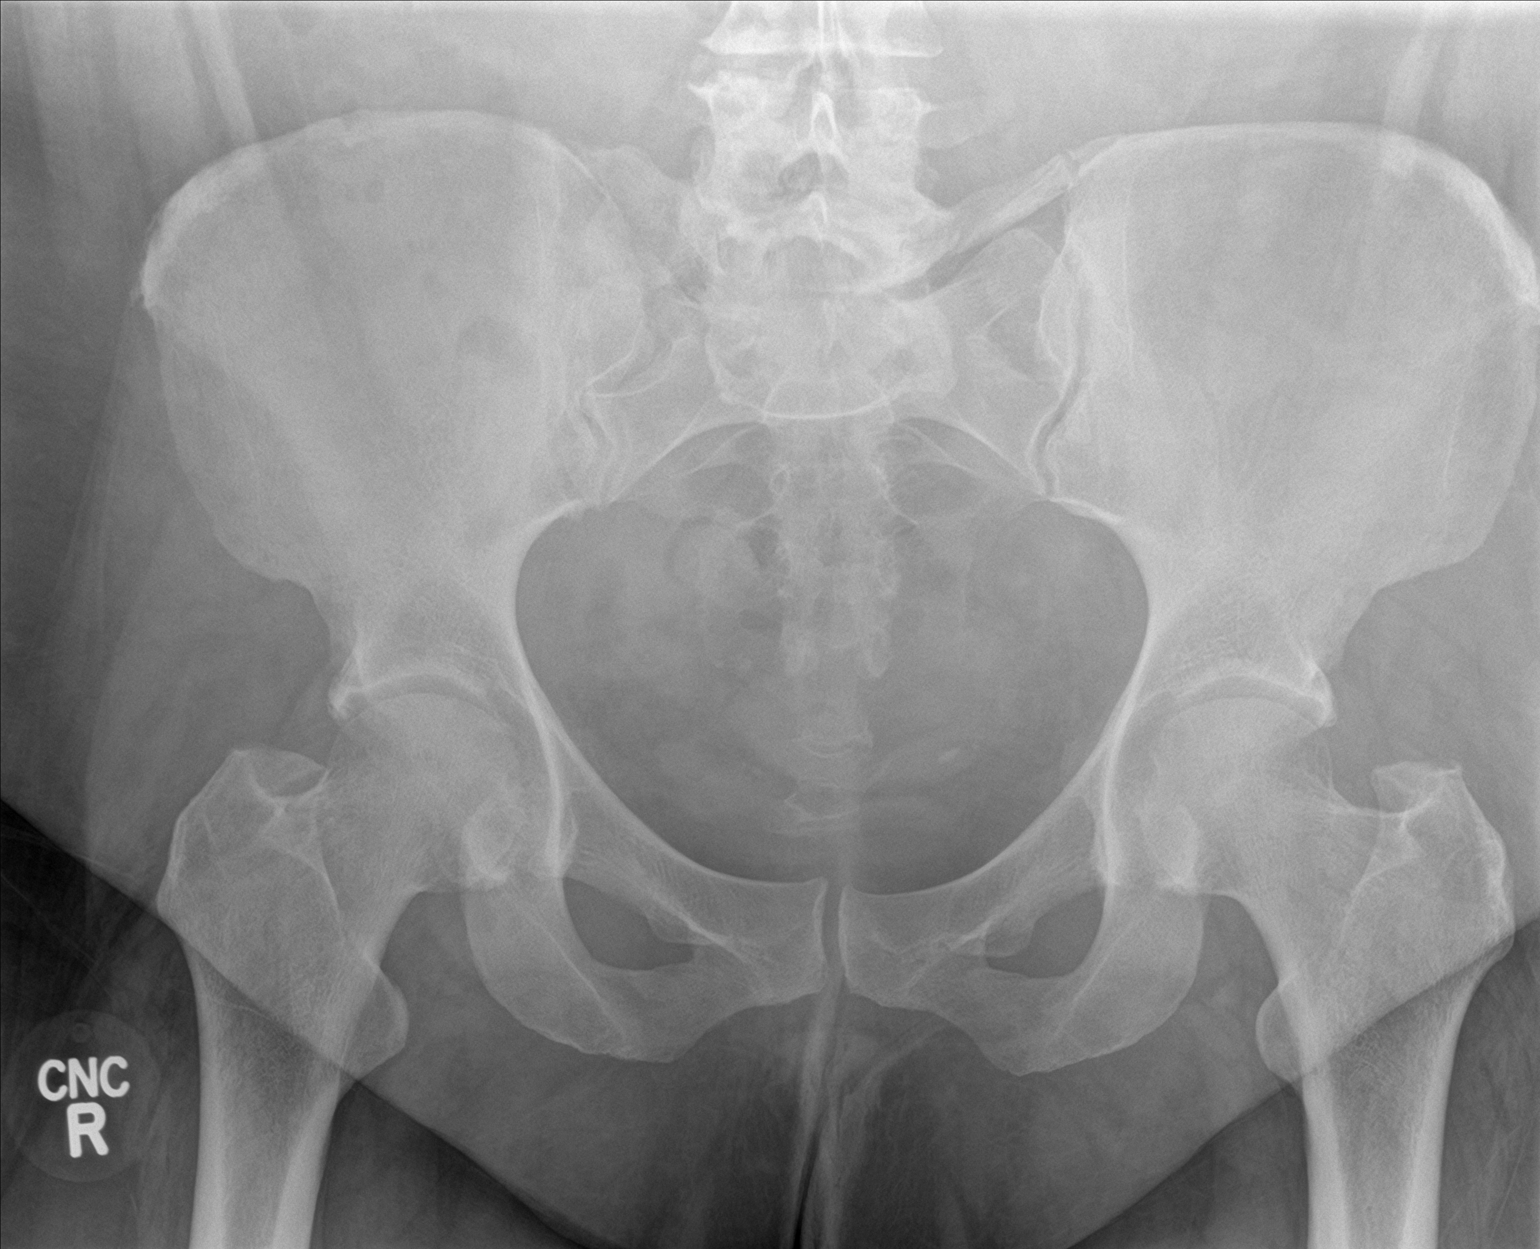
[im 2/2]
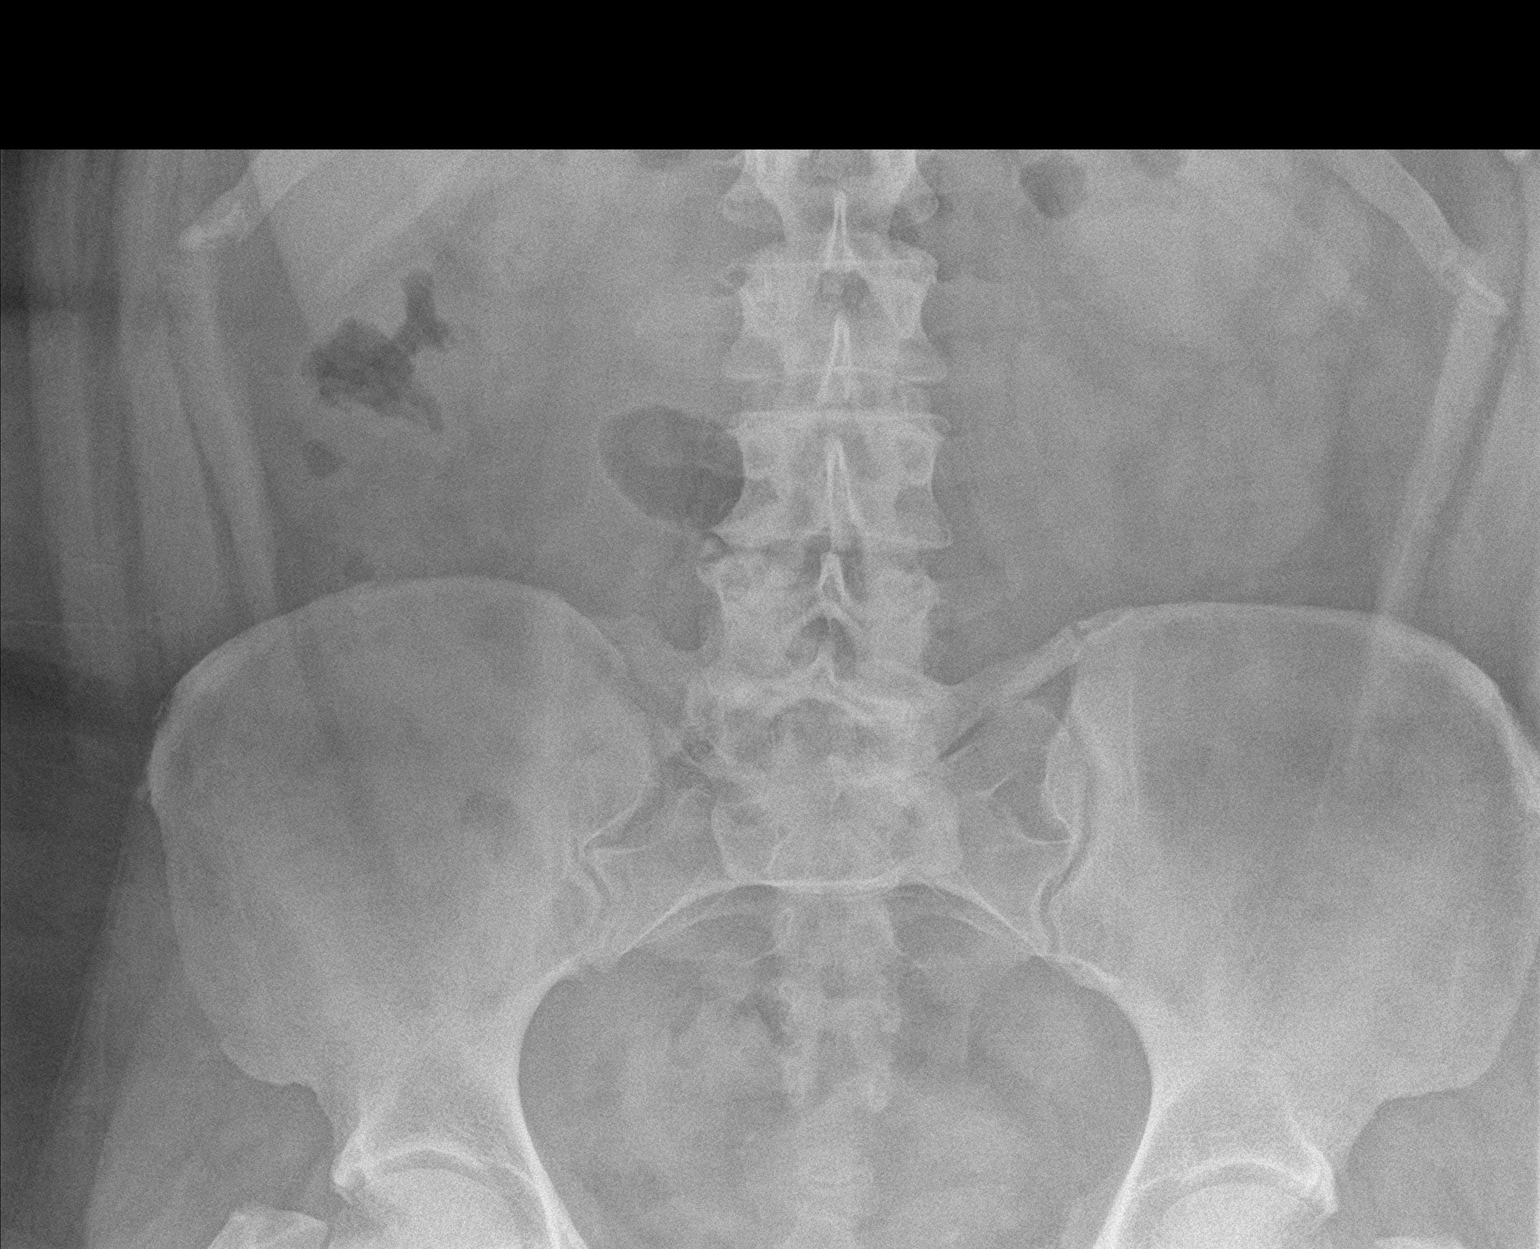

[3 of 3 positions shown; findings below may reference images not displayed]

FINDINGS: Previously identified distal left ureteral stone is unchanged in
position. Soft tissues of the abdomen are unremarkable. No bowel
distention or free air. Degenerative change thoracolumbar spine and
both hips.
IMPRESSION: Distal left ureteral stone is unchanged in position.

## 2021-02-04 IMAGING — US US RENAL
1 series · 14 of 25 positions shown · non-contrast
Comparison: CT 06/17/2019.  Ultrasound 02/10/2016.

CLINICAL DATA: Kidney stone left ureter on prior imaging. History
of prior lithotripsy and passing stone fragments.

EXAM:
RENAL / URINARY TRACT ULTRASOUND COMPLETE

[Series 1: us renal · 0.23mm/px · 14 of 35 slices shown]
[im 1/35]
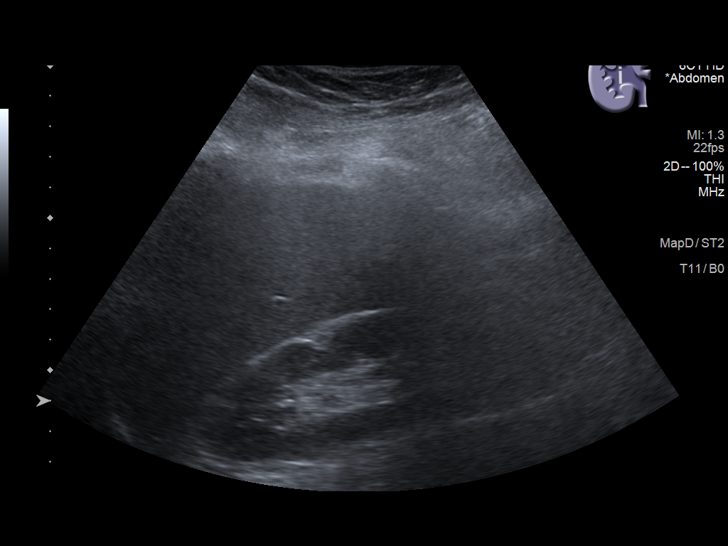
[im 3/35]
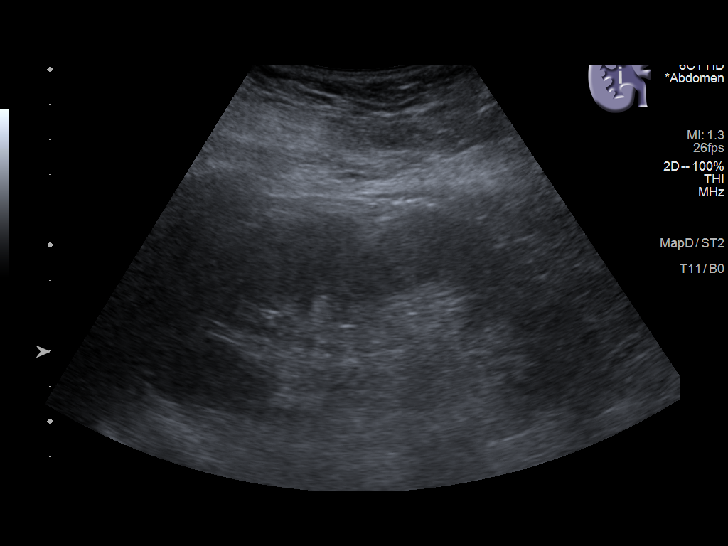
[im 6/35]
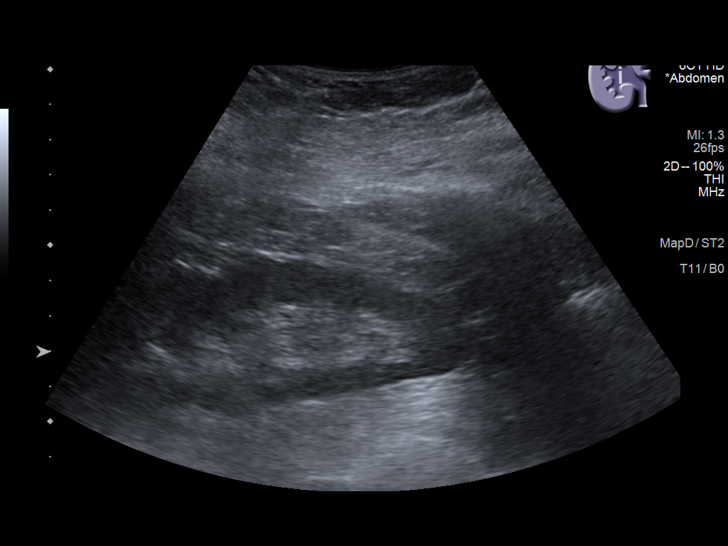
[im 9/35]
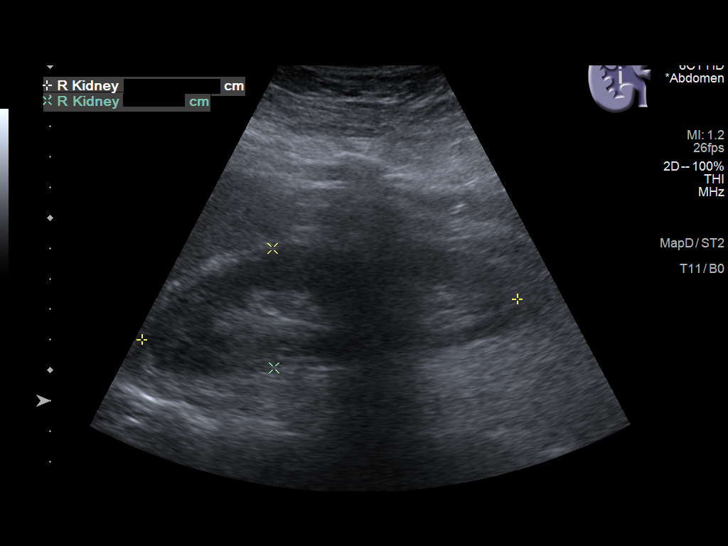
[im 12/35]
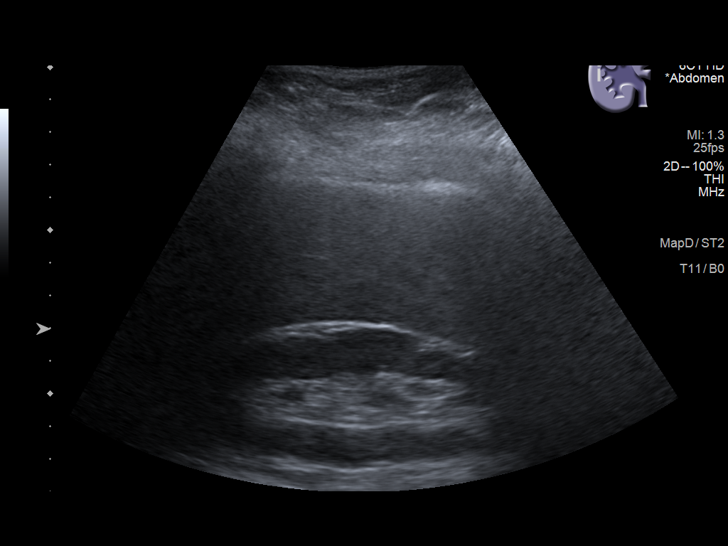
[im 13/35]
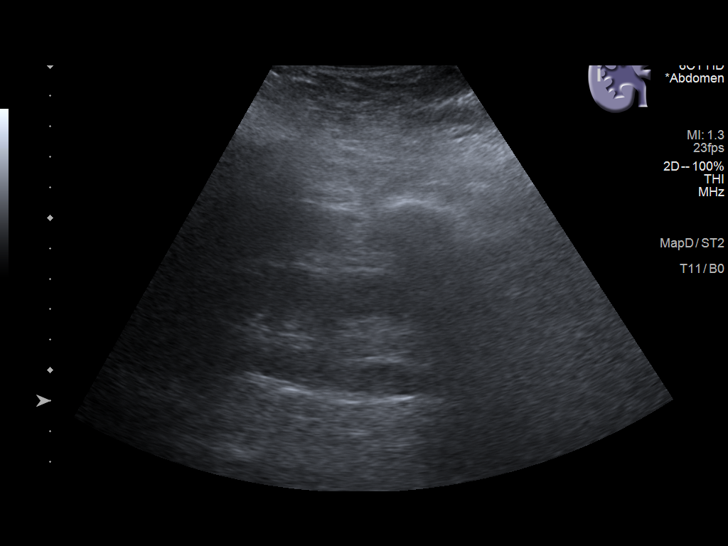
[im 16/35]
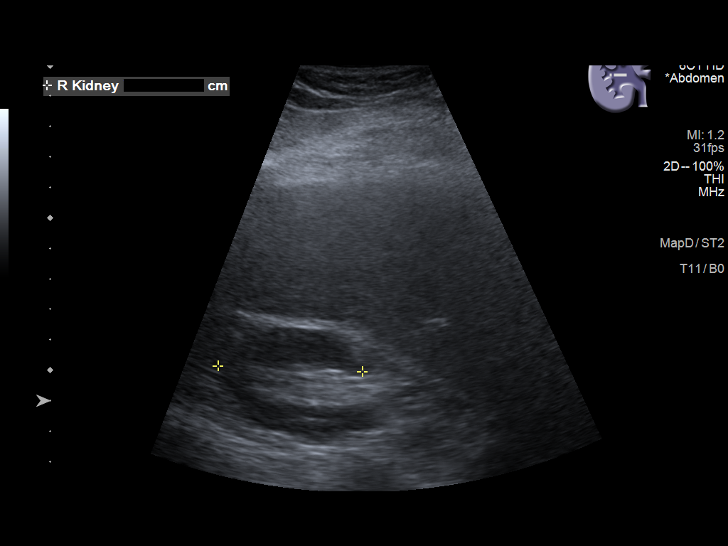
[im 19/35]
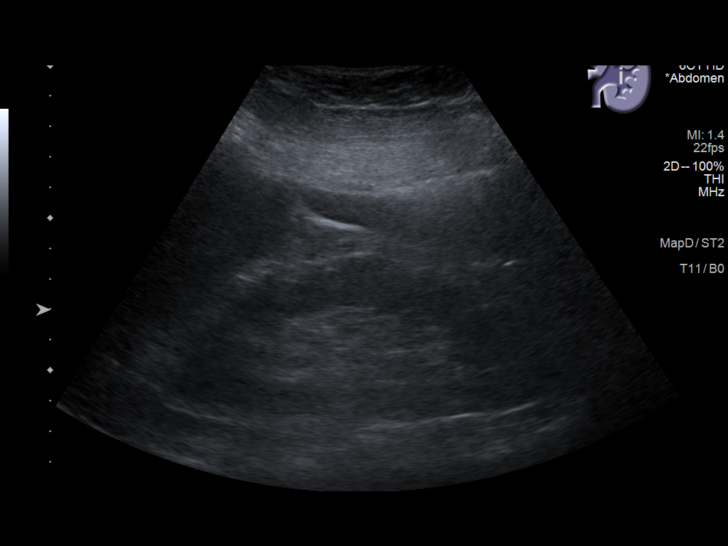
[im 22/35]
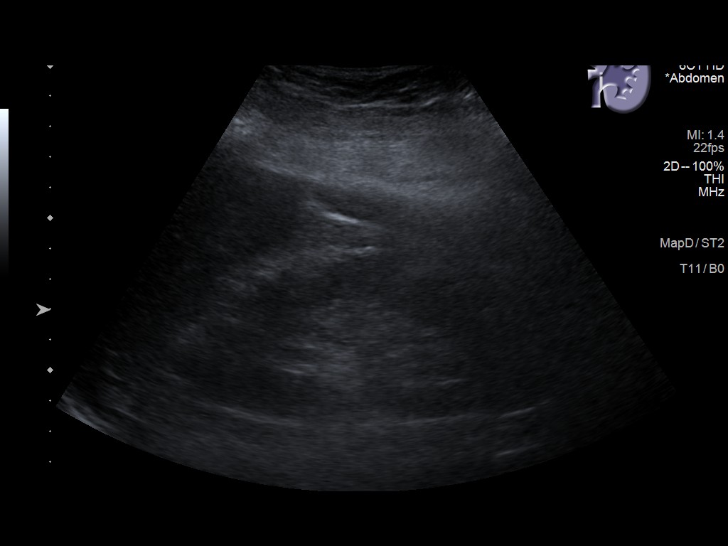
[im 23/35]
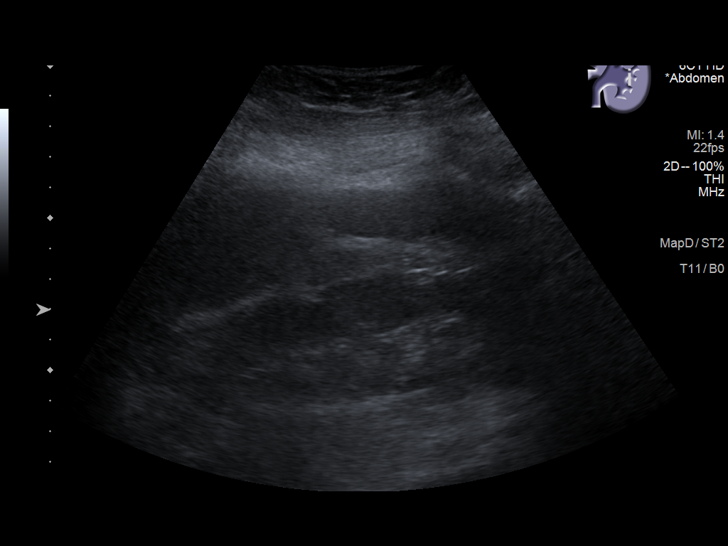
[im 26/35]
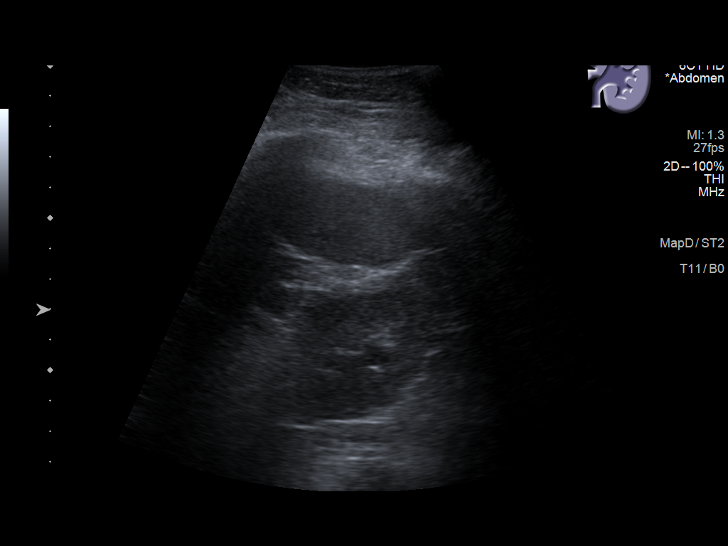
[im 29/35]
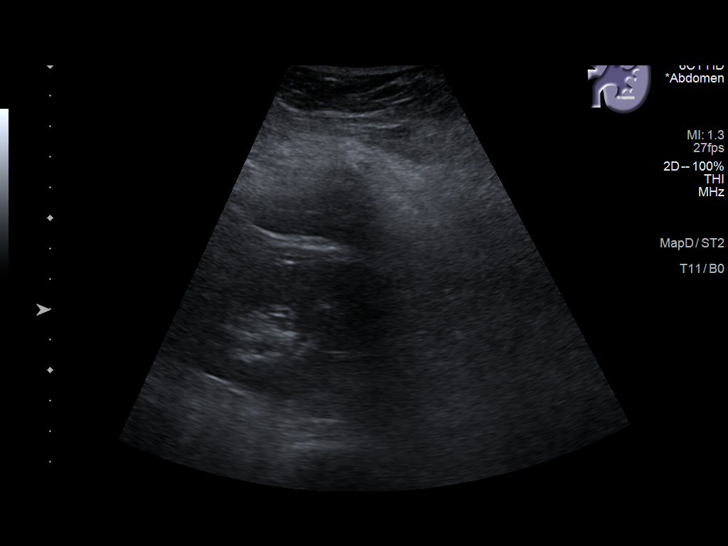
[im 32/35]
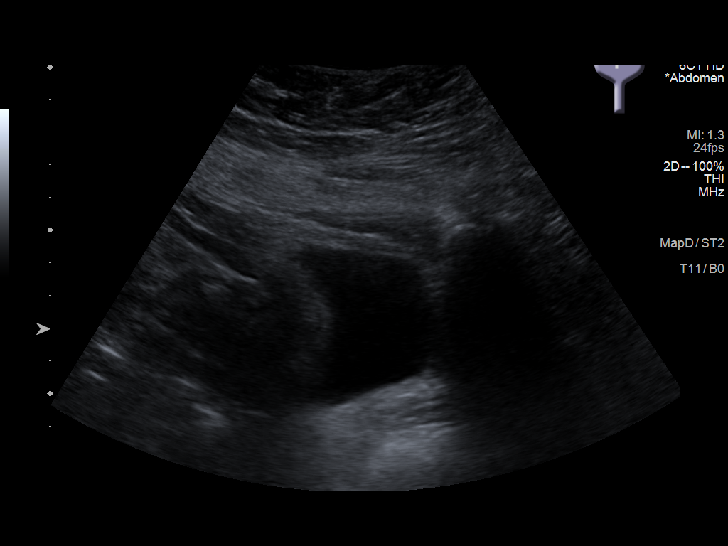
[im 35/35]
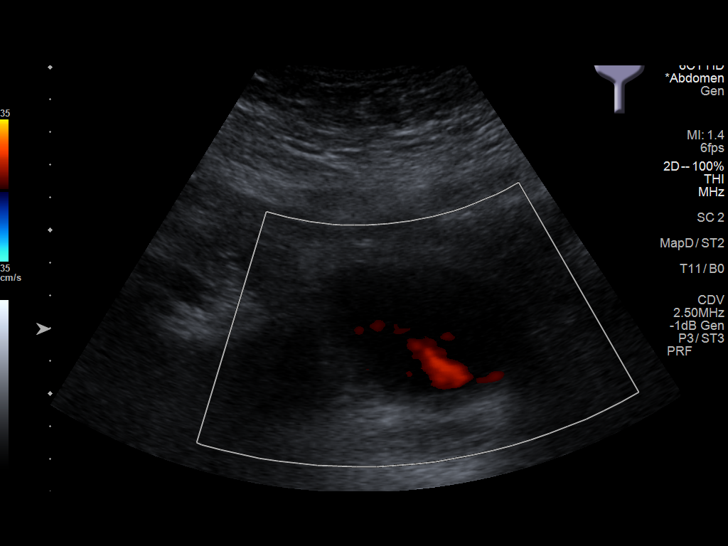

[14 of 25 positions shown; findings below may reference images not displayed]

FINDINGS: Right Kidney:

Renal measurements: 12.4 x 3.9 x 4.1 cm = volume: 120.5 mL .
Echogenicity within normal limits. No mass or hydronephrosis
visualized.

Left Kidney:

Renal measurements: 12.5 x 5.2 x 5.6 cm = volume: 189.5 mL.
Echogenicity within normal limits. No mass or hydronephrosis
visualized.

Bladder:

Appears normal for degree of bladder distention.

Other:

None.
IMPRESSION: No acute or focal abnormality identified.  No hydronephrosis.

## 2022-04-05 ENCOUNTER — Other Ambulatory Visit: Payer: Self-pay | Admitting: Family Medicine

## 2022-04-05 DIAGNOSIS — Z1231 Encounter for screening mammogram for malignant neoplasm of breast: Secondary | ICD-10-CM

## 2022-04-10 ENCOUNTER — Encounter: Payer: Self-pay | Admitting: Family Medicine

## 2022-04-25 ENCOUNTER — Ambulatory Visit
Admission: RE | Admit: 2022-04-25 | Discharge: 2022-04-25 | Disposition: A | Discharge status: Home / Self Care | Payer: BLUE CROSS/BLUE SHIELD | Source: Ambulatory Visit | Attending: Family Medicine | Admitting: Family Medicine

## 2022-04-25 DIAGNOSIS — Z1231 Encounter for screening mammogram for malignant neoplasm of breast: Secondary | ICD-10-CM | POA: Diagnosis present

## 2022-05-11 ENCOUNTER — Emergency Department
Admission: EM | Admit: 2022-05-11 | Discharge: 2022-05-11 | Discharge status: Against Medical Advice | Payer: Medicaid Other

## 2022-10-11 ENCOUNTER — Ambulatory Visit: Payer: Medicaid Other | Admitting: Dermatology

## 2023-01-02 ENCOUNTER — Telehealth: Payer: Self-pay

## 2023-01-02 NOTE — Telephone Encounter (Signed)
This patient was transfer to my phone and left a voicemail.  She is returning call from this morning regarding referral for left lower quadrant abdominal pain.  Not for colonoscopy.  Please call patient back.

## 2023-02-13 ENCOUNTER — Ambulatory Visit: Payer: Medicaid Other | Admitting: Dermatology

## 2023-03-27 ENCOUNTER — Other Ambulatory Visit: Payer: Self-pay | Admitting: Family Medicine

## 2023-03-27 DIAGNOSIS — Z1231 Encounter for screening mammogram for malignant neoplasm of breast: Secondary | ICD-10-CM

## 2023-04-30 ENCOUNTER — Ambulatory Visit
Admission: RE | Admit: 2023-04-30 | Discharge: 2023-04-30 | Disposition: A | Discharge status: Home / Self Care | Payer: Medicaid Other | Source: Ambulatory Visit | Attending: Family Medicine | Admitting: Family Medicine

## 2023-04-30 DIAGNOSIS — Z1231 Encounter for screening mammogram for malignant neoplasm of breast: Secondary | ICD-10-CM | POA: Diagnosis present

## 2023-06-09 ENCOUNTER — Encounter: Payer: Self-pay | Admitting: Emergency Medicine

## 2023-06-09 ENCOUNTER — Emergency Department
Admission: EM | Admit: 2023-06-09 | Discharge: 2023-06-09 | Disposition: A | Discharge status: Home / Self Care | Payer: Medicaid Other | Attending: Emergency Medicine | Admitting: Emergency Medicine

## 2023-06-09 ENCOUNTER — Other Ambulatory Visit: Payer: Self-pay

## 2023-06-09 DIAGNOSIS — R599 Enlarged lymph nodes, unspecified: Secondary | ICD-10-CM | POA: Insufficient documentation

## 2023-06-09 DIAGNOSIS — J0110 Acute frontal sinusitis, unspecified: Secondary | ICD-10-CM | POA: Insufficient documentation

## 2023-06-09 DIAGNOSIS — R0981 Nasal congestion: Secondary | ICD-10-CM | POA: Diagnosis present

## 2023-06-09 LAB — CBC WITH DIFFERENTIAL/PLATELET
Abs Immature Granulocytes: 0.02 10*3/uL (ref 0.00–0.07)
Basophils Absolute: 0.1 10*3/uL (ref 0.0–0.1)
Basophils Relative: 1 %
Eosinophils Absolute: 0.1 10*3/uL (ref 0.0–0.5)
Eosinophils Relative: 1 %
HCT: 44.8 % (ref 36.0–46.0)
Hemoglobin: 14.9 g/dL (ref 12.0–15.0)
Immature Granulocytes: 0 %
Lymphocytes Relative: 21 %
Lymphs Abs: 1.6 10*3/uL (ref 0.7–4.0)
MCH: 31.2 pg (ref 26.0–34.0)
MCHC: 33.3 g/dL (ref 30.0–36.0)
MCV: 93.7 fL (ref 80.0–100.0)
Monocytes Absolute: 0.6 10*3/uL (ref 0.1–1.0)
Monocytes Relative: 8 %
Neutro Abs: 5.3 10*3/uL (ref 1.7–7.7)
Neutrophils Relative %: 69 %
Platelets: 221 10*3/uL (ref 150–400)
RBC: 4.78 MIL/uL (ref 3.87–5.11)
RDW: 13.3 % (ref 11.5–15.5)
WBC: 7.6 10*3/uL (ref 4.0–10.5)
nRBC: 0 % (ref 0.0–0.2)

## 2023-06-09 LAB — GROUP A STREP BY PCR: Group A Strep by PCR: NOT DETECTED

## 2023-06-09 MED ORDER — AMOXICILLIN-POT CLAVULANATE 875-125 MG PO TABS
1.0000 | ORAL_TABLET | Freq: Once | ORAL | Status: AC
  Administered 2023-06-09: 1 via ORAL
  Filled 2023-06-09: qty 1

## 2023-06-09 MED ORDER — AMOXICILLIN-POT CLAVULANATE 875-125 MG PO TABS: 1.0000 | ORAL_TABLET | Freq: Two times a day (BID) | ORAL | 0 refills | Status: AC

## 2023-06-09 NOTE — ED Triage Notes (Signed)
Pt via POV from home. Pt c/o R sided swollen that she noticed this morning. States she has done some recent travelling. States she recently had a COVID test and Flu test done and it was negative. Pt is A&OX4 and NAD

## 2023-06-09 NOTE — ED Provider Notes (Signed)
[facility] Provider Note    Event Date/Time   First MD Initiated Contact with Patient 06/09/23 1302     (approximate)   History   Adenopathy   HPI Victoria Kane is a 52 y.o. female presenting today for adenopathy.  Patient states 1 week ago she was having sinus pressure, drainage, and congestion.  She was seen by her PCP and started on nasal decongestion but no antibiotics.  She has had ongoing drainage from her sinuses since then.  This morning she noticed swelling underneath her right jaw.  She has some pain at the area.  No difficulty with swallowing or breathing.  Notes yellowish discharge from her nose.  Otherwise denies fevers, sweats, shortness of breath, chest pain, nausea, vomiting.     Physical Exam   Triage Vital Signs: ED Triage Vitals  Encounter Vitals Group     BP 06/09/23 1223 125/67     Systolic BP Percentile --      Diastolic BP Percentile --      Pulse Rate 06/09/23 1223 65     Resp 06/09/23 1223 18     Temp 06/09/23 1223 99 F (37.2 C)     Temp Source 06/09/23 1223 Oral     SpO2 06/09/23 1223 98 %     Weight 06/09/23 1224 235 lb (106.6 kg)     Height 06/09/23 1224 5\' 4"  (1.626 m)     Head Circumference --      Peak Flow --      Pain Score 06/09/23 1224 0     Pain Loc --      Pain Education --      Exclude from Growth Chart --     Most recent vital signs: Vitals:   06/09/23 1223  BP: 125/67  Pulse: 65  Resp: 18  Temp: 99 F (37.2 C)  SpO2: 98%   I have reviewed the vital signs. General:  Awake, alert, no acute distress. Head:  Normocephalic, Atraumatic. EENT:  PERRL, EOMI, Oral mucosa pink and moist, Neck is supple.  Palpable tender lymphadenopathy to right submandibular region without significant swelling noted.  No erythema.  Poor dentition present with maxillary teeth but no obvious infection or abscess noted to mandibular teeth. Cardiovascular: Regular rate, 2+ distal pulses. Respiratory:  Normal respiratory  effort, symmetrical expansion, no distress.   Extremities:  Moving all four extremities through full ROM without pain.   Neuro:  Alert and oriented.  Interacting appropriately.   Skin:  Warm, dry, no rash.   Psych: Appropriate affect.     ED Results / Procedures / Treatments   Labs (all labs ordered are listed, but only abnormal results are displayed) Labs Reviewed  GROUP A STREP BY PCR  RESP PANEL BY RT-PCR (RSV, FLU A&B, COVID)  [id number]  CBC WITH DIFFERENTIAL/PLATELET     EKG    RADIOLOGY    PROCEDURES:  Critical Care performed: No  Procedures   MEDICATIONS ORDERED IN ED: Medications  amoxicillin-clavulanate (AUGMENTIN) 875-125 MG per tablet 1 tablet (has no administration in time range)     IMPRESSION / MDM / ASSESSMENT AND PLAN / ED COURSE  I reviewed the triage vital signs and the nursing notes.                              Differential diagnosis includes, but is not limited to, viral sinusitis, bacterial sinusitis, reactive lymphadenopathy, less likely lymphoma, soft  tissue infection.  Patient's presentation is most consistent with acute complicated illness / injury requiring diagnostic workup.  Patient is a 52 year old female presenting today for ongoing sinus infection for 7 days as well as new lymphadenopathy on her right neck.  Concern for bacterial sinusitis given prolonged nature of her course at this time with new reactive lymphadenopathy.  No difficulty opening jaw, swallowing, or breathing.  No significant unilateral edema to the neck.  No fevers or chills.  CBC unremarkable with no concern for lymphoma at this time.  Low suspicion for soft tissue infection.  Given prolonged nature of sinusitis, will start antibiotic to treat bacterial infection.  Patient was given strict return precautions and told to follow-up with PCP.  Clinical Course as of 06/09/23 1431  Sun Jun 09, 2023  1400 CBC with Differential Unremarkable [DW]  1409 Group A Strep by PCR:  NOT DETECTED [DW]    Clinical Course User Index [DW] Janith Lima, MD     FINAL CLINICAL IMPRESSION(S) / ED DIAGNOSES   Final diagnoses:  Acute non-recurrent frontal sinusitis  Reactive lymphadenopathy     Rx / DC Orders   ED Discharge Orders          Ordered    amoxicillin-clavulanate (AUGMENTIN) 875-125 MG tablet  2 times daily        06/09/23 1431             Note:  This document was prepared using Dragon voice recognition software and may include unintentional dictation errors.   Janith Lima, MD 06/09/23 [phone]

## 2023-06-09 NOTE — Discharge Instructions (Signed)
You were seen in the emergency department for ongoing nasal congestion symptoms as well as swelling under your right jaw.  Given ongoing symptoms over a week, I am concerned about possible bacterial infection in your sinuses and will prescribe an antibiotic to take.  I suspect the swelling under your right jaw is reaction of the lymph nodes from the infection but otherwise reassuring.  Please follow-up with your primary care provider within the next couple days for reassessment.  Please return to the emergency department if you have worsening swelling,

## 2023-06-09 NOTE — ED Provider Triage Note (Signed)
Emergency Medicine Provider Triage Evaluation Note  Victoria Kane , a 52 y.o. female  was evaluated in triage.  Pt complains of right sided cervical lymphadenopathy and sore throat. Was just at a funeral. No voice change  Review of Systems  Positive: Sore throat, lymphadenopathy Negative: fever  Physical Exam  There were no vitals taken for this visit. Gen:   Awake, no distress   Resp:  Normal effort  MSK:   Moves extremities without difficulty  Other:  No voice change, no trismus  Medical Decision Making  Medically screening exam initiated at 12:20 PM.  Appropriate orders placed.  Victoria Kane was informed that the remainder of the evaluation will be completed by another provider, this initial triage assessment does not replace that evaluation, and the importance of remaining in the ED until their evaluation is complete.     Jackelyn Hoehn, PA-C 06/09/23 1226

## 2023-06-09 NOTE — ED Notes (Signed)
Pt refused Covid swab.

## 2023-06-09 NOTE — ED Notes (Signed)
Pt refusing COVID test at this time, states she just had one done and explained to pt that the results are not in our system. Pt continues to refuse.

## 2023-06-10 NOTE — Group Note (Deleted)

## 2024-03-27 ENCOUNTER — Other Ambulatory Visit: Payer: Self-pay | Admitting: Pediatrics

## 2024-03-27 DIAGNOSIS — Z1231 Encounter for screening mammogram for malignant neoplasm of breast: Secondary | ICD-10-CM

## 2024-05-05 ENCOUNTER — Ambulatory Visit
Admission: RE | Admit: 2024-05-05 | Discharge: 2024-05-05 | Disposition: A | Discharge status: Home / Self Care | Source: Ambulatory Visit | Attending: Pediatrics | Admitting: Pediatrics

## 2024-05-05 DIAGNOSIS — Z1231 Encounter for screening mammogram for malignant neoplasm of breast: Secondary | ICD-10-CM | POA: Insufficient documentation
